# Patient Record
Sex: Female | Born: 1960 | Race: White | Hispanic: No | Marital: Married | Smoking: Never smoker
Health system: Southern US, Community
[De-identification: ages and names within clinical notes are randomized; demographics above are authoritative.]

## PROBLEM LIST (undated history)

## (undated) DIAGNOSIS — R7982 Elevated C-reactive protein (CRP): Secondary | ICD-10-CM

## (undated) DIAGNOSIS — E7800 Pure hypercholesterolemia, unspecified: Secondary | ICD-10-CM

## (undated) DIAGNOSIS — N879 Dysplasia of cervix uteri, unspecified: Secondary | ICD-10-CM

## (undated) HISTORY — PX: OOPHORECTOMY: SHX86

## (undated) HISTORY — DX: Pure hypercholesterolemia, unspecified: E78.00

## (undated) HISTORY — DX: Dysplasia of cervix uteri, unspecified: N87.9

## (undated) HISTORY — PX: JOINT REPLACEMENT: SHX530

## (undated) HISTORY — DX: Elevated C-reactive protein (CRP): R79.82

## (undated) HISTORY — PX: TOTAL HIP ARTHROPLASTY: SHX124

---

## 1999-04-19 ENCOUNTER — Encounter: Payer: Self-pay | Admitting: Emergency Medicine

## 1999-04-19 ENCOUNTER — Emergency Department (HOSPITAL_COMMUNITY): Admission: EM | Admit: 1999-04-19 | Discharge: 1999-04-19 | Payer: Self-pay | Admitting: Emergency Medicine

## 1999-12-19 ENCOUNTER — Other Ambulatory Visit: Admission: RE | Admit: 1999-12-19 | Discharge: 1999-12-19 | Payer: Self-pay | Admitting: Gynecology

## 2000-10-02 ENCOUNTER — Ambulatory Visit (HOSPITAL_COMMUNITY): Admission: RE | Admit: 2000-10-02 | Discharge: 2000-10-02 | Payer: Self-pay | Admitting: Gynecology

## 2000-10-02 ENCOUNTER — Encounter: Payer: Self-pay | Admitting: Gynecology

## 2001-03-05 ENCOUNTER — Other Ambulatory Visit: Admission: RE | Admit: 2001-03-05 | Discharge: 2001-03-05 | Payer: Self-pay | Admitting: Gynecology

## 2001-08-19 ENCOUNTER — Other Ambulatory Visit: Admission: RE | Admit: 2001-08-19 | Discharge: 2001-08-19 | Payer: Self-pay | Admitting: Gynecology

## 2001-12-21 ENCOUNTER — Emergency Department (HOSPITAL_COMMUNITY): Admission: EM | Admit: 2001-12-21 | Discharge: 2001-12-21 | Payer: Self-pay | Admitting: Emergency Medicine

## 2002-03-06 ENCOUNTER — Other Ambulatory Visit: Admission: RE | Admit: 2002-03-06 | Discharge: 2002-03-06 | Payer: Self-pay | Admitting: Gynecology

## 2003-03-18 ENCOUNTER — Other Ambulatory Visit: Admission: RE | Admit: 2003-03-18 | Discharge: 2003-03-18 | Payer: Self-pay | Admitting: Gynecology

## 2003-07-23 ENCOUNTER — Other Ambulatory Visit: Admission: RE | Admit: 2003-07-23 | Discharge: 2003-07-23 | Payer: Self-pay | Admitting: Gynecology

## 2003-08-14 HISTORY — PX: CERVICAL BIOPSY  W/ LOOP ELECTRODE EXCISION: SUR135

## 2004-03-01 ENCOUNTER — Other Ambulatory Visit: Admission: RE | Admit: 2004-03-01 | Discharge: 2004-03-01 | Payer: Self-pay | Admitting: Gynecology

## 2004-03-09 ENCOUNTER — Ambulatory Visit (HOSPITAL_COMMUNITY): Admission: RE | Admit: 2004-03-09 | Discharge: 2004-03-09 | Payer: Self-pay | Admitting: Gynecology

## 2005-03-03 ENCOUNTER — Other Ambulatory Visit: Admission: RE | Admit: 2005-03-03 | Discharge: 2005-03-03 | Payer: Self-pay | Admitting: Gynecology

## 2005-03-24 ENCOUNTER — Ambulatory Visit (HOSPITAL_COMMUNITY): Admission: RE | Admit: 2005-03-24 | Discharge: 2005-03-24 | Payer: Self-pay | Admitting: Gynecology

## 2005-10-18 ENCOUNTER — Other Ambulatory Visit: Admission: RE | Admit: 2005-10-18 | Discharge: 2005-10-18 | Payer: Self-pay | Admitting: Gynecology

## 2006-03-13 ENCOUNTER — Other Ambulatory Visit: Admission: RE | Admit: 2006-03-13 | Discharge: 2006-03-13 | Payer: Self-pay | Admitting: Gynecology

## 2006-05-07 ENCOUNTER — Ambulatory Visit (HOSPITAL_COMMUNITY): Admission: RE | Admit: 2006-05-07 | Discharge: 2006-05-07 | Payer: Self-pay | Admitting: Gynecology

## 2007-03-15 ENCOUNTER — Other Ambulatory Visit: Admission: RE | Admit: 2007-03-15 | Discharge: 2007-03-15 | Payer: Self-pay | Admitting: Gynecology

## 2007-05-10 ENCOUNTER — Ambulatory Visit (HOSPITAL_COMMUNITY): Admission: RE | Admit: 2007-05-10 | Discharge: 2007-05-10 | Payer: Self-pay | Admitting: Gynecology

## 2008-01-20 ENCOUNTER — Observation Stay (HOSPITAL_COMMUNITY): Admission: AD | Admit: 2008-01-20 | Discharge: 2008-01-21 | Payer: Self-pay | Admitting: Internal Medicine

## 2008-04-23 ENCOUNTER — Other Ambulatory Visit: Admission: RE | Admit: 2008-04-23 | Discharge: 2008-04-23 | Payer: Self-pay | Admitting: Gynecology

## 2008-07-08 ENCOUNTER — Ambulatory Visit (HOSPITAL_COMMUNITY): Admission: RE | Admit: 2008-07-08 | Discharge: 2008-07-08 | Payer: Self-pay | Admitting: Gynecology

## 2009-03-08 ENCOUNTER — Ambulatory Visit: Payer: Self-pay | Admitting: Gynecology

## 2009-04-26 ENCOUNTER — Encounter: Payer: Self-pay | Admitting: Gynecology

## 2009-04-26 ENCOUNTER — Other Ambulatory Visit: Admission: RE | Admit: 2009-04-26 | Discharge: 2009-04-26 | Payer: Self-pay | Admitting: Gynecology

## 2009-04-26 ENCOUNTER — Ambulatory Visit: Payer: Self-pay | Admitting: Gynecology

## 2010-01-14 ENCOUNTER — Ambulatory Visit (HOSPITAL_COMMUNITY): Admission: RE | Admit: 2010-01-14 | Discharge: 2010-01-14 | Payer: Self-pay | Admitting: Gynecology

## 2010-05-17 ENCOUNTER — Ambulatory Visit: Payer: Self-pay | Admitting: Gynecology

## 2010-05-17 ENCOUNTER — Other Ambulatory Visit: Admission: RE | Admit: 2010-05-17 | Discharge: 2010-05-17 | Payer: Self-pay | Admitting: Gynecology

## 2010-12-22 ENCOUNTER — Other Ambulatory Visit: Payer: Self-pay | Admitting: Gynecology

## 2010-12-22 DIAGNOSIS — Z1231 Encounter for screening mammogram for malignant neoplasm of breast: Secondary | ICD-10-CM

## 2011-01-18 ENCOUNTER — Ambulatory Visit (HOSPITAL_COMMUNITY): Payer: BC Managed Care – PPO

## 2011-01-26 ENCOUNTER — Ambulatory Visit (HOSPITAL_COMMUNITY): Payer: BC Managed Care – PPO

## 2011-01-31 ENCOUNTER — Ambulatory Visit (HOSPITAL_COMMUNITY)
Admission: RE | Admit: 2011-01-31 | Discharge: 2011-01-31 | Disposition: A | Discharge status: Home / Self Care | Payer: BC Managed Care – PPO | Source: Ambulatory Visit | Attending: Gynecology | Admitting: Gynecology

## 2011-01-31 DIAGNOSIS — Z1231 Encounter for screening mammogram for malignant neoplasm of breast: Secondary | ICD-10-CM | POA: Insufficient documentation

## 2011-03-28 NOTE — Consult Note (Signed)
NAMESHERROL, VICARS                ACCOUNT NO.:  [id number]   MEDICAL RECORD NO.:  [id number]          PATIENT TYPE:  OBV   LOCATION:  6527                         FACILITY:  [facility]   PHYSICIAN:  Colleen Can. Deborah Chalk, M.D.DATE OF BIRTH:  Jun 07, 1961   DATE OF CONSULTATION:  01/20/2008  DATE OF DISCHARGE:                                 CONSULTATION   REASON FOR CONSULTATION:  Thank you very much for asking me to see [name].  She is 50 year old female who had onset of substernal chest pain  with a little bit of tightness on the way to work.  There was a pressure-  like sensation associated with a clamminess.  She was seen in Dr.  Newell Coral office and admitted to rule out myocardial infarction.  EKG  was negative.  Cardiovascular risk factors include a positive family  history of heart disease with father dying in his 28s.  He was a smoker.  Andrey Campanile has had hypercholesterolemia with cholesterol levels of 299, all  this in 2008, and she is on simvastatin with the last lipids checked  last week and total cholesterol being 199.   She had no real dietary or other physical indiscretions that could  precipitate this type of discomfort.  She has had no gallbladder disease  that she knows of.  She is physically active and works out regularly.   PAST MEDICAL HISTORY:  1. She has had an ovarian fallopian tube removed.  2. Fractured wrist.   ALLERGIES:  No known drug allergies.   SOCIAL HISTORY:  She is a nonsmoker.  She drinks alcohol occasionally.  Her husband is a Pharmacist, hospital.  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   FAMILY HISTORY:  Her father died at age 80.  He was a smoker.  He died  of myocardial infarction.  Mother died at age 29 of metastatic cancer of  unknown origin.  One brother died of alcoholic liver disease and renal  disease.  One sister, age 50 had ovarian cancer.  One brother, age 23,  is alive and well.   REVIEW OF SYSTEMS:  Otherwise, review of systems is  negative.  GU is  negative.  She has had recent constipation treated with MiraLax.  Her  diet is moderate in nature.   PHYSICAL EXAMINATION:  GENERAL:  She is a pleasant, 50 year old female.  HEENT:  Negative.  NECK:  Supple without bruits.  LUNGS:  Clear.  HEART:  Regular rate and rhythm.  ABDOMEN:  Soft, nontender.  EXTREMITIES:  Without edema.  Pedal pulses are intact.   LABORATORY DATA AND X-RAY FINDINGS:  EKG is normal.   IMPRESSION:  1. Single episode of deep visceral chest pain, rule out myocardial      infarction.  2. Hypercholesterolemia.  3. Positive family history of heart disease.   RECOMMENDATIONS:  Will rule out myocardial infarction.  Will have low  threshold for catheterization, but if all is negative, will consider  outpatient stress Cardiolite study.  Stress Cardiolite study can be  arranged tomorrow afternoon.  I would consider discharge on aspirin and  Plavix x1 month with consideration of Pepcid as well.      Colleen Can. Deborah Chalk, M.D.  Electronically Signed     SNT/MEDQ  D:  01/20/2008  T:  01/21/2008  Job:  [id number]

## 2011-03-28 NOTE — Discharge Summary (Signed)
[name], [name]                ACCOUNT NO.:  [id number]   MEDICAL RECORD NO.:  [id number]          PATIENT TYPE:  INP   LOCATION:  6527                         FACILITY:  [facility]   PHYSICIAN:  Theressa Millard, M.D.    DATE OF BIRTH:  Mar 25, 1961   DATE OF ADMISSION:  01/20/2008  DATE OF DISCHARGE:  01/21/2008                               DISCHARGE SUMMARY   OBSERVATION DIAGNOSIS:  Chest discomfort.   DISCHARGE DIAGNOSES:  1. Chest discomfort, no evidence of coronary disease so far.  2. Hypercholesterolemia.  3. Family history of heart disease.   The patient is a 50 year old white female who was brought into the  hospital for observation because of chest pressure.  Initial EKG was  negative.   HOSPITAL COURSE:  The patient was admitted and was placed on  nitroglycerin paste.  This resulted a severe headache and some nausea  and vomiting.  This resolved with appropriate medications.  Myocardial  infarction was ruled out on the basis of serial enzymes.  She was seen  in consultation by Dr. Delfin Edis and she is to undergo an outpatient  stress Cardiolite later today in his office.   DISCHARGE MEDICATIONS:  Simvastatin 20 mg daily, aspirin 325 mg daily.   FOLLOW UP:  She will see me as scheduled.  She will see Dr. Deborah Chalk this  afternoon for a stress test.   Return to work tomorrow half time and then the next day full time.  Activities as tolerated.      Theressa Millard, M.D.  Electronically Signed     JO/MEDQ  D:  01/21/2008  T:  01/21/2008  Job:  [id number]

## 2011-05-29 ENCOUNTER — Other Ambulatory Visit: Payer: Self-pay | Admitting: Gynecology

## 2011-05-29 ENCOUNTER — Other Ambulatory Visit (HOSPITAL_COMMUNITY)
Admission: RE | Admit: 2011-05-29 | Discharge: 2011-05-29 | Disposition: A | Discharge status: Home / Self Care | Payer: BC Managed Care – PPO | Source: Ambulatory Visit | Attending: Gynecology | Admitting: Gynecology

## 2011-05-29 ENCOUNTER — Encounter (INDEPENDENT_AMBULATORY_CARE_PROVIDER_SITE_OTHER): Payer: BC Managed Care – PPO | Admitting: Gynecology

## 2011-05-29 DIAGNOSIS — Z124 Encounter for screening for malignant neoplasm of cervix: Secondary | ICD-10-CM | POA: Insufficient documentation

## 2011-05-29 DIAGNOSIS — Z833 Family history of diabetes mellitus: Secondary | ICD-10-CM

## 2011-05-29 DIAGNOSIS — Z13220 Encounter for screening for lipoid disorders: Secondary | ICD-10-CM

## 2011-05-29 DIAGNOSIS — Z01419 Encounter for gynecological examination (general) (routine) without abnormal findings: Secondary | ICD-10-CM

## 2011-05-29 DIAGNOSIS — Z3041 Encounter for surveillance of contraceptive pills: Secondary | ICD-10-CM

## 2011-07-03 ENCOUNTER — Other Ambulatory Visit: Payer: BC Managed Care – PPO

## 2011-08-07 LAB — BASIC METABOLIC PANEL
BUN: 12
CO2: 26
Calcium: 9.1
Chloride: 107
Creatinine, Ser: 0.97
GFR calc Af Amer: >60
GFR calc non Af Amer: >60
Glucose, Bld: 112 — ABNORMAL HIGH
Potassium: 4.4
Sodium: 137

## 2011-08-07 LAB — COMPREHENSIVE METABOLIC PANEL
ALT: 22
AST: 24
Albumin: 3.7
Alkaline Phosphatase: 53
BUN: 12
CO2: 24
Calcium: 9.2
Chloride: 106
Creatinine, Ser: 0.95
GFR calc Af Amer: >60
GFR calc non Af Amer: >60
Glucose, Bld: 131 — ABNORMAL HIGH
Potassium: 3.9
Sodium: 138
Total Bilirubin: 0.8
Total Protein: 7.1

## 2011-08-07 LAB — PROTIME-INR
INR: 0.9
Prothrombin Time: 12.3

## 2011-08-07 LAB — CARDIAC PANEL(CRET KIN+CKTOT+MB+TROPI)
CK, MB: 0.7
CK, MB: 0.8
Relative Index: INVALID
Total CK: 92
Troponin I: <0.01
Troponin I: <0.01

## 2011-08-07 LAB — TROPONIN I: Troponin I: <0.01

## 2011-08-07 LAB — CBC
HCT: 43.3
Hemoglobin: 14.9
MCHC: 34.5
MCV: 90.2
Platelets: 232
RBC: 4.8
RDW: 12.4
WBC: 9

## 2012-02-20 ENCOUNTER — Other Ambulatory Visit: Payer: Self-pay | Admitting: Gynecology

## 2012-02-20 DIAGNOSIS — Z1231 Encounter for screening mammogram for malignant neoplasm of breast: Secondary | ICD-10-CM

## 2012-02-21 ENCOUNTER — Ambulatory Visit (HOSPITAL_COMMUNITY)
Admission: RE | Admit: 2012-02-21 | Discharge: 2012-02-21 | Disposition: A | Discharge status: Home / Self Care | Payer: BC Managed Care – PPO | Source: Ambulatory Visit | Attending: Gynecology | Admitting: Gynecology

## 2012-02-21 DIAGNOSIS — Z1231 Encounter for screening mammogram for malignant neoplasm of breast: Secondary | ICD-10-CM | POA: Insufficient documentation

## 2012-04-11 ENCOUNTER — Encounter: Payer: Self-pay | Admitting: Gynecology

## 2012-04-11 ENCOUNTER — Ambulatory Visit (INDEPENDENT_AMBULATORY_CARE_PROVIDER_SITE_OTHER): Payer: BC Managed Care – PPO | Admitting: Gynecology

## 2012-04-11 DIAGNOSIS — R350 Frequency of micturition: Secondary | ICD-10-CM

## 2012-04-11 DIAGNOSIS — N390 Urinary tract infection, site not specified: Secondary | ICD-10-CM

## 2012-04-11 DIAGNOSIS — IMO0001 Reserved for inherently not codable concepts without codable children: Secondary | ICD-10-CM

## 2012-04-11 LAB — URINALYSIS W MICROSCOPIC + REFLEX CULTURE
Bilirubin Urine: NEGATIVE
Crystals: NONE SEEN
Glucose, UA: NEGATIVE mg/dL
Nitrite: NEGATIVE
Protein, ur: NEGATIVE mg/dL
Specific Gravity, Urine: <1.005 — ABNORMAL LOW (ref 1.005–1.030)
Urobilinogen, UA: 0.2 mg/dL (ref 0.0–1.0)

## 2012-04-11 MED ORDER — SULFAMETHOXAZOLE-TRIMETHOPRIM 800-160 MG PO TABS: 1.0000 | ORAL_TABLET | Freq: Two times a day (BID) | ORAL | Status: AC

## 2012-04-11 NOTE — Progress Notes (Signed)
Patient presents with several day history of frequency and mild end stream dysuria. No fever chills low back pain or other constitutional symptoms.  Exam Spine straight without CVA tenderness Abdomen soft with mild suprapubic tenderness. No rebound guarding masses or organomegaly.  Assessment and plan: UA and symptoms consistent with UTI. We'll treat with Septra DS 1 by mouth twice a day x3 days, follow up if symptoms persist or recur. She is due for her annual exam in July and has this scheduled.

## 2012-04-11 NOTE — Patient Instructions (Addendum)
Take Septra twice daily for 3 days. Call me if your symptoms persist or recur. Follow up for your annual exam as scheduled this summer.

## 2012-05-29 ENCOUNTER — Ambulatory Visit (INDEPENDENT_AMBULATORY_CARE_PROVIDER_SITE_OTHER): Payer: BC Managed Care – PPO | Admitting: Gynecology

## 2012-05-29 ENCOUNTER — Encounter: Payer: BC Managed Care – PPO | Admitting: Gynecology

## 2012-05-29 ENCOUNTER — Encounter: Payer: Self-pay | Admitting: Gynecology

## 2012-05-29 VITALS — BP 110/70 | Ht 69.0 in | Wt 175.0 lb

## 2012-05-29 DIAGNOSIS — E7800 Pure hypercholesterolemia, unspecified: Secondary | ICD-10-CM | POA: Insufficient documentation

## 2012-05-29 DIAGNOSIS — Z131 Encounter for screening for diabetes mellitus: Secondary | ICD-10-CM

## 2012-05-29 DIAGNOSIS — Z13220 Encounter for screening for lipoid disorders: Secondary | ICD-10-CM

## 2012-05-29 DIAGNOSIS — Z309 Encounter for contraceptive management, unspecified: Secondary | ICD-10-CM

## 2012-05-29 DIAGNOSIS — N951 Menopausal and female climacteric states: Secondary | ICD-10-CM

## 2012-05-29 DIAGNOSIS — Z01419 Encounter for gynecological examination (general) (routine) without abnormal findings: Secondary | ICD-10-CM

## 2012-05-29 LAB — CBC WITH DIFFERENTIAL/PLATELET
Basophils Absolute: 0 10*3/uL (ref 0.0–0.1)
Basophils Relative: 1 % (ref 0–1)
Eosinophils Absolute: 0.2 10*3/uL (ref 0.0–0.7)
Eosinophils Relative: 3 % (ref 0–5)
Hemoglobin: 14.4 g/dL (ref 12.0–15.0)
Lymphocytes Relative: 35 % (ref 12–46)
Lymphs Abs: 2.2 10*3/uL (ref 0.7–4.0)
Neutro Abs: 3.5 10*3/uL (ref 1.7–7.7)

## 2012-05-29 LAB — LIPID PANEL: Triglycerides: 157 mg/dL — ABNORMAL HIGH (ref <150)

## 2012-05-29 MED ORDER — NORETHINDRONE ACET-ETHINYL EST 1-20 MG-MCG PO TABS: 1.0000 | ORAL_TABLET | Freq: Every day | ORAL | Status: DC

## 2012-05-29 NOTE — Patient Instructions (Signed)
Office will contact you with lab results. If cholesterol elevated you need to see Dr. Earl Gala in follow up. You need to schedule colonoscopy. If FSH menopausal hormone elevated then we'll stop birth control pills, keep menstrual calendar and use backup contraception. If normal then we need you to repeat the level in 6 months during the pill free week.

## 2012-05-29 NOTE — Progress Notes (Signed)
[name] Dec 24, 1960 [id number]        51 y.o.  [id number] for annual exam.  Doing well.  Past medical history,surgical history, medications, allergies, family history and social history were all reviewed and documented in the EPIC chart. ROS:  Was performed and pertinent positives and negatives are included in the history.  Exam: [name] assistant Filed Vitals:   05/29/12 0913  BP: 110/70  Height: 5\' 9"  (1.753 m)  Weight: 175 lb (79.379 kg)   General appearance  Normal Skin grossly normal Head/Neck normal with no cervical or supraclavicular adenopathy thyroid normal Lungs  clear Cardiac RR, without RMG Abdominal  soft, nontender, without masses, organomegaly or hernia Breasts  examined lying and sitting without masses, retractions, discharge or axillary adenopathy. Pelvic  Ext/BUS/vagina  normal   Cervix  normal   Uterus  anteverted, normal size, shape and contour, midline and mobile nontender   Adnexa  Without masses or tenderness    Anus and perineum  normal   Rectovaginal  normal sphincter tone without palpated masses or tenderness.    Assessment/Plan:  51 y.o. [id number] female for annual exam.   1. Contraception. Patients on low dose oral contraception. She does do some hot flushes throughout the month does not seem worse during her pill free week.  Check TSH FSH. If FSH is normal she'll continue on low dose oral contraception for now and repeat an FSH during a pill free week several months from now.  If FSH is elevated will plan on stopping her birth control pills, keep a menstrual calendar and use backup contraception for now to see what she is doing from a menstrual function standpoint.  If menopausal symptoms persist/worsen then we'll discuss possibilities for HRT.  I again reviewed the risks of oral contraceptives particularly with advancing age to include stroke heart attack DVT. She understands and accepts these risks. 2. History hypercholesterolemia. She saw Dr. Earl Gala but  never followed up with him. She asked if I would recheck a lipid profile today. She understands if it is elevated she needs to see Dr. Earl Gala for management. 3. Mammography. Patient had her mammogram in April which was normal. Continue with annual mammography. SBE monthly reviewed. 4. Pap smear. Last Pap smear 2012. No Pap smear done today. Patient has history of low-grade dysplasia margins free with LEEP 2004 with negative Pap smears since then. We'll plan every 3-5 your screening per current screening guidelines. 5. Colonoscopy. Patient's colonoscopy I again recommended she arrange this and she acknowledges my recommendations. 6. Health maintenance. Baseline CBC lipid profile glucose urinalysis TSH and FSH ordered.    Dara Lords MD, 9:48 AM 05/29/2012

## 2012-05-30 LAB — URINALYSIS W MICROSCOPIC + REFLEX CULTURE
Bacteria, UA: NONE SEEN
Bilirubin Urine: NEGATIVE
Crystals: NONE SEEN
Hgb urine dipstick: NEGATIVE
Ketones, ur: NEGATIVE mg/dL
Nitrite: NEGATIVE
Specific Gravity, Urine: 1.013 (ref 1.005–1.030)
Urobilinogen, UA: 0.2 mg/dL (ref 0.0–1.0)

## 2013-06-11 ENCOUNTER — Telehealth: Payer: Self-pay | Admitting: *Deleted

## 2013-06-11 MED ORDER — NORETHINDRONE ACET-ETHINYL EST 1-20 MG-MCG PO TABS: 1.0000 | ORAL_TABLET | Freq: Every day | ORAL | Status: DC

## 2013-06-11 NOTE — Telephone Encounter (Signed)
Pt has annual scheduled on 07/17/13 will need refill on birth control pills until seen in office. Rx sent with 1 refill. Pt informed

## 2013-07-17 ENCOUNTER — Encounter: Payer: Self-pay | Admitting: Gynecology

## 2013-08-20 ENCOUNTER — Ambulatory Visit (INDEPENDENT_AMBULATORY_CARE_PROVIDER_SITE_OTHER): Payer: BC Managed Care – PPO | Admitting: Gynecology

## 2013-08-20 ENCOUNTER — Encounter: Payer: Self-pay | Admitting: Gynecology

## 2013-08-20 VITALS — BP 120/74 | Ht 69.0 in | Wt 171.0 lb

## 2013-08-20 DIAGNOSIS — Z01419 Encounter for gynecological examination (general) (routine) without abnormal findings: Secondary | ICD-10-CM

## 2013-08-20 DIAGNOSIS — Z13220 Encounter for screening for lipoid disorders: Secondary | ICD-10-CM

## 2013-08-20 DIAGNOSIS — N951 Menopausal and female climacteric states: Secondary | ICD-10-CM

## 2013-08-20 NOTE — Patient Instructions (Addendum)
Return for fasting blood work.  Otherwise follow up in one year for annual exam. Followup sooner if menopausal symptoms worsened and you want to rediscuss hormone replacement therapy.  Schedule colonoscopy with [facility] gastroenterology at [phone] or [facility] gastroenterology at [phone]  Call to Schedule your mammogram  Facilities in [place]: 1)  [facility] [place], [place] Phone: [phone] 2)  [facility], [phone] Phone: [phone] 3)  Dr. Yolanda Bonine at [facility] [address] Phone: [phone]     Mammogram A mammogram is an X-ray test to find changes in a woman's breast. You should get a mammogram if:  You are 50 years of age or older  You have risk factors.   Your doctor recommends that you have one.  BEFORE THE TEST  Do not schedule the test the week before your period, especially if your breasts are sore during this time.  On the day of your mammogram:  Wash your breasts and armpits well. After washing, do not put on any deodorant or talcum powder on until after your test.   Eat and drink as you usually do.   Take your medicines as usual.   If you are diabetic and take insulin, make sure you:   Eat before coming for your test.   Take your insulin as usual.   If you cannot keep your appointment, call before the appointment to cancel. Schedule another appointment.  TEST  You will need to undress from the waist up. You will put on a hospital gown.   Your breast will be put on the mammogram machine, and it will press firmly on your breast with a piece of plastic called a compression paddle. This will make your breast flatter so that the machine can X-ray all parts of your breast.   Both breasts will be X-rayed. Each breast will be X-rayed from above and from the side. An X-ray might need to be taken again if the picture is not good enough.    The mammogram will last about 15 to 30 minutes.  AFTER THE TEST Finding out the results of your test Ask when your test results will be ready. Make sure you get your test results.  Document Released: 01/26/2009 Document Revised: 10/19/2011 Document Reviewed: 01/26/2009 [facility] Patient Information 2012 [place], [place].

## 2013-08-20 NOTE — Progress Notes (Signed)
[name] 04-04-1961 [id number]        52 y.o.  [id number] for annual exam.  Several issues that are below.  Past medical history,surgical history, medications, allergies, family history and social history were all reviewed and documented in the EPIC chart.  ROS:  Performed and pertinent positives and negatives are included in the history, assessment and plan .  Exam: [name] assistant Filed Vitals:   08/20/13 1421  BP: 120/74  Height: 5\' 9"  (1.753 m)  Weight: 171 lb (77.565 kg)   General appearance  Normal Skin grossly normal Head/Neck normal with no cervical or supraclavicular adenopathy thyroid normal Lungs  clear Cardiac RR, without RMG Abdominal  soft, nontender, without masses, organomegaly or hernia Breasts  examined lying and sitting without masses, retractions, discharge or axillary adenopathy. Pelvic  Ext/BUS/vagina  normal  Cervix  normal  Uterus  anteverted, normal size, shape and contour, midline and mobile nontender   Adnexa  Without masses or tenderness    Anus and perineum  normal   Rectovaginal  normal sphincter tone without palpated masses or tenderness.    Assessment/Plan:  52 y.o. [id number] female for annual exam.   1. Amenorrhea/menopausal symptoms. Patient had been on Loestrin 120 equivalents but has not had a withdrawal bleed for a year.  Had stopped them several weeks ago and has not had any bleeding but worsening hot flushes and night sweats. We'll check FSH when she does her fasting lab work. Will stay off of BCPs at this point and use backup contraception. I reviewed the issue of HRT and risks to include stroke heart attack DVT and breast cancer. ACOG and NAMS statements for lowest dose for shortest period of time discussed. Patient is not interested in trying at this point but wants to monitor her symptoms.  2. Mammography 02/2012. Patient knows she's overdue and agrees to schedule. SBE monthly reviewed. 3. Pap smear 2012. No Pap smear done today. 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 Plan repeat Pap smear next year a 3 year interval. 4. Colonoscopy never. Strongly recommended that she schedule baseline. Discussed the benefits of early detection and she agrees to arrange. 5. Health maintenance. Baseline CBC comprehensive metabolic panel lipid profile vitamin D urinalysis TSH ordered as a future order and she's going to return fasting. She never followed up with Dr. Earl Gala as discussed last year in reference to her elevated cholesterol.  Note: This document was prepared with digital dictation and possible smart phrase technology. Any transcriptional errors that result from this process are unintentional.   Dara Lords MD, 2:48 PM 08/20/2013

## 2013-08-21 ENCOUNTER — Encounter: Payer: Self-pay | Admitting: Gynecology

## 2013-08-25 ENCOUNTER — Other Ambulatory Visit: Payer: BC Managed Care – PPO

## 2013-08-25 DIAGNOSIS — N951 Menopausal and female climacteric states: Secondary | ICD-10-CM

## 2013-08-25 DIAGNOSIS — Z13220 Encounter for screening for lipoid disorders: Secondary | ICD-10-CM

## 2013-08-25 DIAGNOSIS — Z01419 Encounter for gynecological examination (general) (routine) without abnormal findings: Secondary | ICD-10-CM

## 2013-08-25 LAB — CBC WITH DIFFERENTIAL/PLATELET
Eosinophils Relative: 8 % — ABNORMAL HIGH (ref 0–5)
HCT: 42 % (ref 36.0–46.0)
Lymphocytes Relative: 37 % (ref 12–46)
Lymphs Abs: 2.2 10*3/uL (ref 0.7–4.0)
MCH: 31.5 pg (ref 26.0–34.0)
MCV: 90.5 fL (ref 78.0–100.0)
Monocytes Absolute: 0.5 10*3/uL (ref 0.1–1.0)
Neutro Abs: 2.8 10*3/uL (ref 1.7–7.7)
Platelets: 271 10*3/uL (ref 150–400)
RBC: 4.64 MIL/uL (ref 3.87–5.11)
RDW: 13 % (ref 11.5–15.5)
WBC: 6 10*3/uL (ref 4.0–10.5)

## 2013-08-25 LAB — COMPREHENSIVE METABOLIC PANEL
ALT: 20 U/L (ref 0–35)
BUN: 13 mg/dL (ref 6–23)
CO2: 28 mEq/L (ref 19–32)
Calcium: 9.5 mg/dL (ref 8.4–10.5)
Chloride: 104 mEq/L (ref 96–112)
Creat: 0.8 mg/dL (ref 0.50–1.10)
Glucose, Bld: 97 mg/dL (ref 70–99)
Total Bilirubin: 0.9 mg/dL (ref 0.3–1.2)

## 2013-08-25 LAB — LIPID PANEL
HDL: 61 mg/dL (ref >39)
LDL Cholesterol: 176 mg/dL — ABNORMAL HIGH (ref 0–99)
Triglycerides: 83 mg/dL (ref <150)

## 2013-08-26 LAB — FOLLICLE STIMULATING HORMONE: FSH: 8 m[IU]/mL

## 2013-08-26 LAB — VITAMIN D 25 HYDROXY (VIT D DEFICIENCY, FRACTURES): Vit D, 25-Hydroxy: 55 ng/mL (ref 30–89)

## 2013-09-17 ENCOUNTER — Ambulatory Visit (INDEPENDENT_AMBULATORY_CARE_PROVIDER_SITE_OTHER): Payer: BC Managed Care – PPO | Admitting: Women's Health

## 2013-09-17 ENCOUNTER — Encounter: Payer: Self-pay | Admitting: Women's Health

## 2013-09-17 DIAGNOSIS — Z309 Encounter for contraceptive management, unspecified: Secondary | ICD-10-CM

## 2013-09-17 DIAGNOSIS — R3915 Urgency of urination: Secondary | ICD-10-CM

## 2013-09-17 DIAGNOSIS — IMO0001 Reserved for inherently not codable concepts without codable children: Secondary | ICD-10-CM

## 2013-09-17 DIAGNOSIS — N390 Urinary tract infection, site not specified: Secondary | ICD-10-CM

## 2013-09-17 LAB — URINALYSIS W MICROSCOPIC + REFLEX CULTURE
Casts: NONE SEEN
Crystals: NONE SEEN
Ketones, ur: NEGATIVE mg/dL
Nitrite: NEGATIVE
Protein, ur: NEGATIVE mg/dL
Specific Gravity, Urine: 1.01 (ref 1.005–1.030)
Urobilinogen, UA: 0.2 mg/dL (ref 0.0–1.0)
pH: 7.5 (ref 5.0–8.0)

## 2013-09-17 MED ORDER — SULFAMETHOXAZOLE-TRIMETHOPRIM 800-160 MG PO TABS: 1.0000 | ORAL_TABLET | Freq: Two times a day (BID) | ORAL | Status: DC

## 2013-09-17 MED ORDER — NORETHINDRONE ACET-ETHINYL EST 1-20 MG-MCG PO TABS
1.0000 | ORAL_TABLET | Freq: Every day | ORAL | Status: DC
Start: 2013-09-17 — End: 2014-09-02

## 2013-09-17 NOTE — Patient Instructions (Signed)
Urinary Tract Infection  Urinary tract infections (UTIs) can develop anywhere along your urinary tract. Your urinary tract is your body's drainage system for removing wastes and extra water. Your urinary tract includes two kidneys, two ureters, a bladder, and a urethra. Your kidneys are a pair of bean-shaped organs. Each kidney is about the size of your fist. They are located below your ribs, one on each side of your spine.  CAUSES  Infections are caused by microbes, which are microscopic organisms, including fungi, viruses, and bacteria. These organisms are so small that they can only be seen through a microscope. Bacteria are the microbes that most commonly cause UTIs.  SYMPTOMS   Symptoms of UTIs may vary by age and gender of the patient and by the location of the infection. Symptoms in Verniece Encarnacion women typically include a frequent and intense urge to urinate and a painful, burning feeling in the bladder or urethra during urination. Older women and men are more likely to be tired, shaky, and weak and have muscle aches and abdominal pain. A fever may mean the infection is in your kidneys. Other symptoms of a kidney infection include pain in your back or sides below the ribs, nausea, and vomiting.  DIAGNOSIS  To diagnose a UTI, your caregiver will ask you about your symptoms. Your caregiver also will ask to provide a urine sample. The urine sample will be tested for bacteria and white blood cells. White blood cells are made by your body to help fight infection.  TREATMENT   Typically, UTIs can be treated with medication. Because most UTIs are caused by a bacterial infection, they usually can be treated with the use of antibiotics. The choice of antibiotic and length of treatment depend on your symptoms and the type of bacteria causing your infection.  HOME CARE INSTRUCTIONS   If you were prescribed antibiotics, take them exactly as your caregiver instructs you. Finish the medication even if you feel better after you  have only taken some of the medication.   Drink enough water and fluids to keep your urine clear or pale yellow.   Avoid caffeine, tea, and carbonated beverages. They tend to irritate your bladder.   Empty your bladder often. Avoid holding urine for long periods of time.   Empty your bladder before and after sexual intercourse.   After a bowel movement, women should cleanse from front to back. Use each tissue only once.  SEEK MEDICAL CARE IF:    You have back pain.   You develop a fever.   Your symptoms do not begin to resolve within 3 days.  SEEK IMMEDIATE MEDICAL CARE IF:    You have severe back pain or lower abdominal pain.   You develop chills.   You have nausea or vomiting.   You have continued burning or discomfort with urination.  MAKE SURE YOU:    Understand these instructions.   Will watch your condition.   Will get help right away if you are not doing well or get worse.  Document Released: 08/09/2005 Document Revised: 04/30/2012 Document Reviewed: 12/08/2011  [employer] Patient Information 2014 [employer].

## 2013-09-17 NOTE — Progress Notes (Signed)
Patient ID: [name], female   DOB: 1961-10-06, 52 y.o.   MRN: [id number] Presents with urinary urgency, frequency and discomfort with no relief with cranberry juice for 4 days. Denies vaginal discharge, abdominal pain, fever. Rare  UTIs in the past. Annual exam last month. Had been amenorrheic on Loestrin 1/20,. Stopped Loestrin, FSH 8, had a heavy cycle 2 weeks after stopping pills. Would like to start back on Loestrin.  Exam: Appears well. UA: Large leukocytes, TNTC WBCs, many bacteria.  UTI Contraception management  Plan: Septra DS twice daily for 3 days, prescription, proper use given and reviewed. Urine culture pending. Instructed to call if no relief of symptoms. Loestrin 1/20 prescription, proper use, risk for blood clots and strokes reviewed. Nonsmoker with no known health problems. Start up instructions reviewed. Condoms until next cycle and first month back on.

## 2013-09-23 ENCOUNTER — Telehealth: Payer: Self-pay | Admitting: *Deleted

## 2013-09-23 MED ORDER — CIPROFLOXACIN HCL 250 MG PO TABS: 250.0000 mg | ORAL_TABLET | Freq: Two times a day (BID) | ORAL | Status: DC

## 2013-09-23 NOTE — Telephone Encounter (Signed)
Pt informed with the below note, rx sent. 

## 2013-09-23 NOTE — Telephone Encounter (Signed)
Pt was treated for Septra DS twice daily for 3 days on 09/17/13, on Sunday noticed urgency and pressure back again now with some burning with urination. Please advise

## 2013-09-23 NOTE — Telephone Encounter (Signed)
Please call and call in Cipro 250 twice daily for 3 days #6. Septra not be best antibiotic for infection she had. New antibiotic should completely rectify problem. Have her call if no relief.

## 2014-03-31 ENCOUNTER — Other Ambulatory Visit: Payer: Self-pay | Admitting: Gynecology

## 2014-03-31 DIAGNOSIS — Z1231 Encounter for screening mammogram for malignant neoplasm of breast: Secondary | ICD-10-CM

## 2014-04-08 ENCOUNTER — Ambulatory Visit (HOSPITAL_COMMUNITY)
Admission: RE | Admit: 2014-04-08 | Discharge: 2014-04-08 | Disposition: A | Discharge status: Home / Self Care | Payer: BC Managed Care – PPO | Source: Ambulatory Visit | Attending: Gynecology | Admitting: Gynecology

## 2014-04-08 DIAGNOSIS — Z1231 Encounter for screening mammogram for malignant neoplasm of breast: Secondary | ICD-10-CM | POA: Insufficient documentation

## 2014-04-09 ENCOUNTER — Other Ambulatory Visit: Payer: Self-pay | Admitting: Gynecology

## 2014-04-09 DIAGNOSIS — R928 Other abnormal and inconclusive findings on diagnostic imaging of breast: Secondary | ICD-10-CM

## 2014-04-20 ENCOUNTER — Ambulatory Visit
Admission: RE | Admit: 2014-04-20 | Discharge: 2014-04-20 | Disposition: A | Discharge status: Home / Self Care | Payer: BC Managed Care – PPO | Source: Ambulatory Visit | Attending: Gynecology | Admitting: Gynecology

## 2014-04-20 DIAGNOSIS — R928 Other abnormal and inconclusive findings on diagnostic imaging of breast: Secondary | ICD-10-CM

## 2014-08-31 ENCOUNTER — Telehealth: Payer: Self-pay | Admitting: *Deleted

## 2014-08-31 NOTE — Telephone Encounter (Signed)
Pt called c/o having a cycle after no cycle in 2 years, c/o right side abdomen discomfort as well. Pt transferred to front desk to schedule appointment.

## 2014-09-02 ENCOUNTER — Ambulatory Visit (INDEPENDENT_AMBULATORY_CARE_PROVIDER_SITE_OTHER): Payer: BC Managed Care – PPO | Admitting: Gynecology

## 2014-09-02 ENCOUNTER — Encounter: Payer: Self-pay | Admitting: Gynecology

## 2014-09-02 DIAGNOSIS — N939 Abnormal uterine and vaginal bleeding, unspecified: Secondary | ICD-10-CM

## 2014-09-02 DIAGNOSIS — R102 Pelvic and perineal pain: Secondary | ICD-10-CM

## 2014-09-02 LAB — CBC WITH DIFFERENTIAL/PLATELET
BASOS ABS: 0.1 10*3/uL (ref 0.0–0.1)
BASOS PCT: 1 % (ref 0–1)
EOS ABS: 0.2 10*3/uL (ref 0.0–0.7)
EOS PCT: 3 % (ref 0–5)
HCT: 41.7 % (ref 36.0–46.0)
Hemoglobin: 14 g/dL (ref 12.0–15.0)
LYMPHS PCT: 33 % (ref 12–46)
Lymphs Abs: 2.6 10*3/uL (ref 0.7–4.0)
MCH: 30.6 pg (ref 26.0–34.0)
MCHC: 33.6 g/dL (ref 30.0–36.0)
MCV: 91 fL (ref 78.0–100.0)
Monocytes Absolute: 0.6 10*3/uL (ref 0.1–1.0)
Monocytes Relative: 7 % (ref 3–12)
Neutro Abs: 4.5 10*3/uL (ref 1.7–7.7)
Neutrophils Relative %: 56 % (ref 43–77)
PLATELETS: 316 10*3/uL (ref 150–400)
RBC: 4.58 MIL/uL (ref 3.87–5.11)
RDW: 13.7 % (ref 11.5–15.5)
WBC: 8 10*3/uL (ref 4.0–10.5)

## 2014-09-02 MED ORDER — MEGESTROL ACETATE 20 MG PO TABS: 20.0000 mg | ORAL_TABLET | Freq: Every day | ORAL | Status: DC

## 2014-09-02 MED ORDER — IBUPROFEN 800 MG PO TABS: 800.0000 mg | ORAL_TABLET | Freq: Three times a day (TID) | ORAL | Status: DC | PRN

## 2014-09-02 NOTE — Progress Notes (Signed)
Carly FettersSandra J Harper April 30, 1961 [id number]        53 y.o.  [id number] Presents noting her last menstrual period was several years ago with the onset of vaginal bleeding 5 days ago leading to heavy passage of clots which now seems to be resolving. She's also having a lot of pelvic cramping. Does note several days before her breasts were tender and she started to feel bloated like she did premenstrually when she was having regular periods. Not using contraception. No recent change in medications or steroid use. No hair skin or weight changes.  She is having hot flushes and night sweats.  Past medical history,surgical history, problem list, medications, allergies, family history and social history were all reviewed and documented in the EPIC chart.  Directed ROS with pertinent positives and negatives documented in the history of present illness/assessment and plan.  Exam: [name] assistant General appearance:  Normal Abdomen soft nontender without masses guarding rebound. Pelvic external BUS vagina with light menses flow. Cervix normal. Uterus anteverted normal size midline mobile nontender. Adnexa without masses or tenderness.  Assessment/Plan:  53 y.o. G2X5284G3P2012 with postmenopausal bleeding. Question escape ovulation given the present menstrual feeling before hand. Reviewed other possibilities to include endometrial abnormalities. Check baseline CBC, FSH and qualitative hCG. Schedule sonohysterogram to rule out intracavitary abnormalities allow for endometrial sample various scenarios and treatment plans reviewed depending upon results.  We'll treat transiently with Megace 20 mg daily for one week to resolve her bleeding. ASAP call precautions reviewed. Follow up for ultrasound.     Dara LordsFONTAINE,Tyrhonda Georgiades P MD, 12:16 PM 09/02/2014

## 2014-09-02 NOTE — Patient Instructions (Signed)
Follow up for ultrasound as scheduled 

## 2014-09-03 LAB — FOLLICLE STIMULATING HORMONE: FSH: 24.1 m[IU]/mL

## 2014-09-03 LAB — HCG, QUANTITATIVE, PREGNANCY: hCG, Beta Chain, Quant, S: <2 m[IU]/mL

## 2014-09-07 ENCOUNTER — Other Ambulatory Visit: Payer: Self-pay | Admitting: Gynecology

## 2014-09-07 DIAGNOSIS — N939 Abnormal uterine and vaginal bleeding, unspecified: Secondary | ICD-10-CM

## 2014-09-07 DIAGNOSIS — R102 Pelvic and perineal pain: Secondary | ICD-10-CM

## 2014-09-14 ENCOUNTER — Encounter: Payer: Self-pay | Admitting: Gynecology

## 2014-09-17 ENCOUNTER — Ambulatory Visit (INDEPENDENT_AMBULATORY_CARE_PROVIDER_SITE_OTHER): Payer: BC Managed Care – PPO

## 2014-09-17 ENCOUNTER — Other Ambulatory Visit: Payer: Self-pay | Admitting: Gynecology

## 2014-09-17 ENCOUNTER — Ambulatory Visit (INDEPENDENT_AMBULATORY_CARE_PROVIDER_SITE_OTHER): Payer: BC Managed Care – PPO | Admitting: Gynecology

## 2014-09-17 ENCOUNTER — Encounter: Payer: Self-pay | Admitting: Gynecology

## 2014-09-17 DIAGNOSIS — N939 Abnormal uterine and vaginal bleeding, unspecified: Secondary | ICD-10-CM

## 2014-09-17 DIAGNOSIS — N8000 Endometriosis of the uterus, unspecified: Secondary | ICD-10-CM

## 2014-09-17 DIAGNOSIS — N950 Postmenopausal bleeding: Secondary | ICD-10-CM

## 2014-09-17 DIAGNOSIS — R102 Pelvic and perineal pain: Secondary | ICD-10-CM

## 2014-09-17 DIAGNOSIS — R140 Abdominal distension (gaseous): Secondary | ICD-10-CM

## 2014-09-17 NOTE — Progress Notes (Signed)
Carly FettersSandra J Harper [birth year] [id number]        53 y.o.  [id number] presents for sonohysterogram due to episode of postmenopausal bleeding. Has not had a menses for 2 years and then had a spontaneous heavy bleed with premenstrual type symptoms. FSH checked was 24. It was 8 last year.  Past medical history,surgical history, problem list, medications, allergies, family history and social history were all reviewed and documented in the EPIC chart.  Directed ROS with pertinent positives and negatives documented in the history of present illness/assessment and plan.  Exam: [name] assistant General appearance:  Normal External BUS vagina with mild atrophic changes. Cervix grossly normal.  Ultrasound shows uterus normal size. Endometrial echo 1.9 mm. Right left ovaries normal. Cul-de-sac negative.  Sonohysterogram performed, sterile technique, easy catheter introduction, good distention with no abnormalities. Endometrial sample taken. Patient tolerated well.  Assessment/Plan:  53 y.o. J4N8295G3P2012 with single episode of postmenopausal bleeding. I suspect given the marginal FSH and the premenstrual symptoms preceding the bleed that she had an escape ovulation. Endometrial echo is thin. Will follow up for endometrial biopsy. Would not be surprised if it was inadequate.  Patient will keep menstrual calendar as long as no further bleeding will monitor. She'll report any further bleeding. She is due for her annual exam next month and will follow up at that time. Sooner if any issues.     Dara LordsFONTAINE,[name] P MD, 5:00 PM 09/17/2014

## 2014-09-17 NOTE — Patient Instructions (Signed)
Office will call you with biopsy results 

## 2014-09-18 LAB — URINALYSIS W MICROSCOPIC + REFLEX CULTURE
BILIRUBIN URINE: NEGATIVE
Casts: NONE SEEN
Crystals: NONE SEEN
GLUCOSE, UA: NEGATIVE mg/dL
HGB URINE DIPSTICK: NEGATIVE
Ketones, ur: NEGATIVE mg/dL
Nitrite: NEGATIVE
PROTEIN: NEGATIVE mg/dL
Specific Gravity, Urine: 1.008 (ref 1.005–1.030)
Urobilinogen, UA: 0.2 mg/dL (ref 0.0–1.0)
pH: 5.5 (ref 5.0–8.0)

## 2014-09-19 LAB — URINE CULTURE
COLONY COUNT: NO GROWTH
ORGANISM ID, BACTERIA: NO GROWTH

## 2014-10-16 ENCOUNTER — Encounter: Payer: BC Managed Care – PPO | Admitting: Gynecology

## 2015-06-23 ENCOUNTER — Other Ambulatory Visit: Payer: Self-pay | Admitting: Gynecology

## 2015-06-23 DIAGNOSIS — Z1231 Encounter for screening mammogram for malignant neoplasm of breast: Secondary | ICD-10-CM

## 2015-06-30 ENCOUNTER — Ambulatory Visit (HOSPITAL_COMMUNITY)
Admission: RE | Admit: 2015-06-30 | Discharge: 2015-06-30 | Disposition: A | Discharge status: Home / Self Care | Payer: BLUE CROSS/BLUE SHIELD | Source: Ambulatory Visit | Attending: Gynecology | Admitting: Gynecology

## 2015-06-30 DIAGNOSIS — Z1231 Encounter for screening mammogram for malignant neoplasm of breast: Secondary | ICD-10-CM

## 2015-08-23 ENCOUNTER — Encounter: Payer: Self-pay | Admitting: Gynecology

## 2015-08-23 ENCOUNTER — Other Ambulatory Visit (HOSPITAL_COMMUNITY)
Admission: RE | Admit: 2015-08-23 | Discharge: 2015-08-23 | Disposition: A | Discharge status: Home / Self Care | Payer: BLUE CROSS/BLUE SHIELD | Source: Ambulatory Visit | Attending: Gynecology | Admitting: Gynecology

## 2015-08-23 ENCOUNTER — Ambulatory Visit (INDEPENDENT_AMBULATORY_CARE_PROVIDER_SITE_OTHER): Payer: BLUE CROSS/BLUE SHIELD | Admitting: Gynecology

## 2015-08-23 VITALS — BP 114/70 | Ht 70.0 in | Wt 156.0 lb

## 2015-08-23 DIAGNOSIS — N951 Menopausal and female climacteric states: Secondary | ICD-10-CM

## 2015-08-23 DIAGNOSIS — N952 Postmenopausal atrophic vaginitis: Secondary | ICD-10-CM | POA: Diagnosis not present

## 2015-08-23 DIAGNOSIS — Z01419 Encounter for gynecological examination (general) (routine) without abnormal findings: Secondary | ICD-10-CM | POA: Diagnosis not present

## 2015-08-23 LAB — CBC WITH DIFFERENTIAL/PLATELET
BASOS ABS: 0.1 10*3/uL (ref 0.0–0.1)
BASOS PCT: 1 % (ref 0–1)
EOS ABS: 0.2 10*3/uL (ref 0.0–0.7)
Eosinophils Relative: 3 % (ref 0–5)
HCT: 41.2 % (ref 36.0–46.0)
Hemoglobin: 14.1 g/dL (ref 12.0–15.0)
Lymphocytes Relative: 33 % (ref 12–46)
Lymphs Abs: 2.6 10*3/uL (ref 0.7–4.0)
MCH: 30.4 pg (ref 26.0–34.0)
MCHC: 34.2 g/dL (ref 30.0–36.0)
MCV: 88.8 fL (ref 78.0–100.0)
MPV: 9.2 fL (ref 8.6–12.4)
Monocytes Absolute: 0.5 10*3/uL (ref 0.1–1.0)
Monocytes Relative: 6 % (ref 3–12)
Neutro Abs: 4.4 10*3/uL (ref 1.7–7.7)
Neutrophils Relative %: 57 % (ref 43–77)
PLATELETS: 258 10*3/uL (ref 150–400)
RBC: 4.64 MIL/uL (ref 3.87–5.11)
RDW: 13.6 % (ref 11.5–15.5)
WBC: 7.8 10*3/uL (ref 4.0–10.5)

## 2015-08-23 NOTE — Progress Notes (Signed)
[name] 1961/05/22 [id number]        54 y.o.  [id number] for annual exam.  Several issues noted below.  Past medical history,surgical history, problem list, medications, allergies, family history and social history were all reviewed and documented as reviewed in the EPIC chart.  ROS:  Performed with pertinent positives and negatives included in the history, assessment and plan.   Additional significant findings :  none   Exam: [name] person Vitals:   08/23/15 1507  BP: 114/70  Height:  (1.778 m)  Weight: 156 lb (70.761 kg)   General appearance:  Normal affect, orientation and appearance. Skin: Grossly normal HEENT: Without gross lesions.  No cervical or supraclavicular adenopathy. Thyroid normal.  Lungs:  Clear without wheezing, rales or rhonchi Cardiac: RR, without RMG Abdominal:  Soft, nontender, without masses, guarding, rebound, organomegaly or hernia Breasts:  Examined lying and sitting without masses, retractions, discharge or axillary adenopathy. Pelvic:  Ext/BUS/vagina with atrophic changes  Cervix normal with atrophic changes. Pap smear done  Uterus anteverted, normal size, shape and contour, midline and mobile nontender   Adnexa  Without masses or tenderness    Anus and perineum  Normal   Rectovaginal  Normal sphincter tone without palpated masses or tenderness.    Assessment/Plan:  54 y.o. [id number] female for annual exam.   1. Postmenopausal/atrophic genital changes. Is having some hot flashes/night sweats. No significant vaginal dryness or dyspareunia. No vaginal bleeding. Options for management reviewed to include OTC products up to and including HRT. Patient's going to try OTC soy based products. Will follow up if she wants to rediscuss HRT. Patient knows to report any vaginal bleeding. 2. Mammography 06/2015. Continue with annual mammography. SBE monthly reviewed. 3. Pap smear 05/2011. Pap smear done today.  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  Normal Pap  smears since then. 4. DEXA never. Will plan further into the menopause. Check vitamin D level today. 5. Colonoscopy never. Stressed the need to schedule that she agrees to do so. Names and numbers provided. 6. Health maintenance. Patient in the process of finding a new primary physician. She requested baseline labs. CBC comprehensive metabolic panel lipid profile urinalysis TSH vitamin D ordered. Follow up in one year, sooner as needed.   Dara Lords MD, 3:33 PM 08/23/2015

## 2015-08-23 NOTE — Addendum Note (Signed)
Addended by: Dayna Barker on: 08/23/2015 03:40 PM   Modules accepted: Orders

## 2015-08-23 NOTE — Patient Instructions (Signed)
Schedule your colonoscopy with either:  [facility]   Address: [address], [place], [place] [address]  [phone]    or  [facility]  Address: [address], [place], [place] [address]  [phone]      You may obtain a copy of any labs that were done today by logging onto MyChart as outlined in the instructions provided with your AVS (after visit summary). The office will not call with normal lab results but certainly if there are any significant abnormalities then we will contact you.   Health Maintenance Adopting a healthy lifestyle and getting preventive care can go a long way to promote health and wellness. Talk with your health care provider about what schedule of regular examinations is right for you. This is a good chance for you to check in with your provider about disease prevention and staying healthy. In between checkups, there are plenty of things you can do on your own. Experts have done a lot of research about which lifestyle changes and preventive measures are most likely to keep you healthy. Ask your health care provider for more information. WEIGHT AND DIET  Eat a healthy diet  Be sure to include plenty of vegetables, fruits, low-fat dairy products, and lean protein.  Do not eat a lot of foods high in solid fats, added sugars, or salt.  Get regular exercise. This is one of the most important things you can do for your health.  Most adults should exercise for at least 150 minutes each week. The exercise should increase your heart rate and make you sweat (moderate-intensity exercise).  Most adults should also do strengthening exercises at least twice a week. This is in addition to the moderate-intensity exercise.  Maintain a healthy weight  Body mass index (BMI) is a measurement that can be used to identify possible weight problems. It estimates body fat based on height and weight. Your health care provider can help determine  your BMI and help you achieve or maintain a healthy weight.  For females 22 years of age and older:   A BMI below 18.5 is considered underweight.  A BMI of 18.5 to 24.9 is normal.  A BMI of 25 to 29.9 is considered overweight.  A BMI of 30 and above is considered obese.  Watch levels of cholesterol and blood lipids  You should start having your blood tested for lipids and cholesterol at 55 years of age, then have this test every 5 years.  You may need to have your cholesterol levels checked more often if:  Your lipid or cholesterol levels are high.  You are older than 55 years of age.  You are at high risk for heart disease.  CANCER SCREENING   Lung Cancer  Lung cancer screening is recommended for adults 19-51 years old who are at high risk for lung cancer because of a history of smoking.  A yearly low-dose CT scan of the lungs is recommended for people who:  Currently smoke.  Have quit within the past 15 years.  Have at least a 30-pack-year history of smoking. A pack year is smoking an average of one pack of cigarettes a day for 1 year.  Yearly screening should continue until it has been 15 years since you quit.  Yearly screening should stop if you develop a health problem that would prevent you from having lung cancer treatment.  Breast Cancer  Practice breast self-awareness. This means understanding how your breasts normally appear and  feel.  It also means doing regular breast self-exams. Let your health care provider know about any changes, no matter how small.  If you are in your 20s or 30s, you should have a clinical breast exam (CBE) by a health care provider every 1-3 years as part of a regular health exam.  If you are 35 or older, have a CBE every year. Also consider having a breast X-ray (mammogram) every year.  If you have a family history of breast cancer, talk to your health care provider about genetic screening.  If you are at high risk for breast  cancer, talk to your health care provider about having an MRI and a mammogram every year.  Breast cancer gene (BRCA) assessment is recommended for women who have family members with BRCA-related cancers. BRCA-related cancers include:  Breast.  Ovarian.  Tubal.  Peritoneal cancers.  Results of the assessment will determine the need for genetic counseling and BRCA1 and BRCA2 testing. Cervical Cancer Routine pelvic examinations to screen for cervical cancer are no longer recommended for nonpregnant women who are considered low risk for cancer of the pelvic organs (ovaries, uterus, and vagina) and who do not have symptoms. A pelvic examination may be necessary if you have symptoms including those associated with pelvic infections. Ask your health care provider if a screening pelvic exam is right for you.   The Pap test is the screening test for cervical cancer for women who are considered at risk.  If you had a hysterectomy for a problem that was not cancer or a condition that could lead to cancer, then you no longer need Pap tests.  If you are older than 65 years, and you have had normal Pap tests for the past 10 years, you no longer need to have Pap tests.  If you have had past treatment for cervical cancer or a condition that could lead to cancer, you need Pap tests and screening for cancer for at least 20 years after your treatment.  If you no longer get a Pap test, assess your risk factors if they change (such as having a new sexual partner). This can affect whether you should start being screened again.  Some women have medical problems that increase their chance of getting cervical cancer. If this is the case for you, your health care provider may recommend more frequent screening and Pap tests.  The human papillomavirus (HPV) test is another test that may be used for cervical cancer screening. The HPV test looks for the virus that can cause cell changes in the cervix. The cells  collected during the Pap test can be tested for HPV.  The HPV test can be used to screen women 72 years of age and older. Getting tested for HPV can extend the interval between normal Pap tests from three to five years.  An HPV test also should be used to screen women of any age who have unclear Pap test results.  After 55 years of age, women should have HPV testing as often as Pap tests.  Colorectal Cancer  This type of cancer can be detected and often prevented.  Routine colorectal cancer screening usually begins at 55 years of age and continues through 55 years of age.  Your health care provider may recommend screening at an earlier age if you have risk factors for colon cancer.  Your health care provider may also recommend using home test kits to check for hidden blood in the stool.  A small camera  at the end of a tube can be used to examine your colon directly (sigmoidoscopy or colonoscopy). This is done to check for the earliest forms of colorectal cancer.  Routine screening usually begins at age 40.  Direct examination of the colon should be repeated every 5-10 years through 55 years of age. However, you may need to be screened more often if early forms of precancerous polyps or small growths are found. Skin Cancer  Check your skin from head to toe regularly.  Tell your health care provider about any new moles or changes in moles, especially if there is a change in a mole's shape or color.  Also tell your health care provider if you have a mole that is larger than the size of a pencil eraser.  Always use sunscreen. Apply sunscreen liberally and repeatedly throughout the day.  Protect yourself by wearing long sleeves, pants, a wide-brimmed hat, and sunglasses whenever you are outside. HEART DISEASE, DIABETES, AND HIGH BLOOD PRESSURE   Have your blood pressure checked at least every 1-2 years. High blood pressure causes heart disease and increases the risk of stroke.  If  you are between 74 years and 72 years old, ask your health care provider if you should take aspirin to prevent strokes.  Have regular diabetes screenings. This involves taking a blood sample to check your fasting blood sugar level.  If you are at a normal weight and have a low risk for diabetes, have this test once every three years after 55 years of age.  If you are overweight and have a high risk for diabetes, consider being tested at a younger age or more often. PREVENTING INFECTION  Hepatitis B  If you have a higher risk for hepatitis B, you should be screened for this virus. You are considered at high risk for hepatitis B if:  You were born in a country where hepatitis B is common. Ask your health care provider which countries are considered high risk.  Your parents were born in a high-risk country, and you have not been immunized against hepatitis B (hepatitis B vaccine).  You have HIV or AIDS.  You use needles to inject street drugs.  You live with someone who has hepatitis B.  You have had sex with someone who has hepatitis B.  You get hemodialysis treatment.  You take certain medicines for conditions, including cancer, organ transplantation, and autoimmune conditions. Hepatitis C  Blood testing is recommended for:  Everyone born from [birth year] through 1966.  Anyone with known risk factors for hepatitis C. Sexually transmitted infections (STIs)  You should be screened for sexually transmitted infections (STIs) including gonorrhea and chlamydia if:  You are sexually active and are younger than 55 years of age.  You are older than 55 years of age and your health care provider tells you that you are at risk for this type of infection.  Your sexual activity has changed since you were last screened and you are at an increased risk for chlamydia or gonorrhea. Ask your health care provider if you are at risk.  If you do not have HIV, but are at risk, it may be recommended that  you take a prescription medicine daily to prevent HIV infection. This is called pre-exposure prophylaxis (PrEP). You are considered at risk if:  You are sexually active and do not regularly use condoms or know the HIV status of your partner(s).  You take drugs by injection.  You are sexually active with a partner  who has HIV. Talk with your health care provider about whether you are at high risk of being infected with HIV. If you choose to begin PrEP, you should first be tested for HIV. You should then be tested every 3 months for as long as you are taking PrEP.  PREGNANCY   If you are premenopausal and you may become pregnant, ask your health care provider about preconception counseling.  If you may become pregnant, take 400 to 800 micrograms (mcg) of folic acid every day.  If you want to prevent pregnancy, talk to your health care provider about birth control (contraception). OSTEOPOROSIS AND MENOPAUSE   Osteoporosis is a disease in which the bones lose minerals and strength with aging. This can result in serious bone fractures. Your risk for osteoporosis can be identified using a bone density scan.  If you are 8 years of age or older, or if you are at risk for osteoporosis and fractures, ask your health care provider if you should be screened.  Ask your health care provider whether you should take a calcium or vitamin D supplement to lower your risk for osteoporosis.  Menopause may have certain physical symptoms and risks.  Hormone replacement therapy may reduce some of these symptoms and risks. Talk to your health care provider about whether hormone replacement therapy is right for you.  HOME CARE INSTRUCTIONS   Schedule regular health, dental, and eye exams.  Stay current with your immunizations.   Do not use any tobacco products including cigarettes, chewing tobacco, or electronic cigarettes.  If you are pregnant, do not drink alcohol.  If you are breastfeeding, limit how  much and how often you drink alcohol.  Limit alcohol intake to no more than 1 drink per day for nonpregnant women. One drink equals 12 ounces of beer, 5 ounces of wine, or 1 ounces of hard liquor.  Do not use street drugs.  Do not share needles.  Ask your health care provider for help if you need support or information about quitting drugs.  Tell your health care provider if you often feel depressed.  Tell your health care provider if you have ever been abused or do not feel safe at home. Document Released: 05/15/2011 Document Revised: 03/16/2014 Document Reviewed: 10/01/2013 [facility] Patient Information 2015 [place], [place]. This information is not intended to replace advice given to you by your health care provider. Make sure you discuss any questions you have with your health care provider.

## 2015-08-24 LAB — LIPID PANEL
CHOLESTEROL: 226 mg/dL — AB (ref 125–200)
HDL: 65 mg/dL (ref >=46)
LDL Cholesterol: 138 mg/dL — ABNORMAL HIGH (ref <130)
Total CHOL/HDL Ratio: 3.5 Ratio (ref <=5.0)
Triglycerides: 117 mg/dL (ref <150)
VLDL: 23 mg/dL (ref <30)

## 2015-08-24 LAB — COMPREHENSIVE METABOLIC PANEL
ALT: 17 U/L (ref 6–29)
AST: 21 U/L (ref 10–35)
Albumin: 4.5 g/dL (ref 3.6–5.1)
Alkaline Phosphatase: 69 U/L (ref 33–130)
BILIRUBIN TOTAL: 0.6 mg/dL (ref 0.2–1.2)
BUN: 18 mg/dL (ref 7–25)
CO2: 27 mmol/L (ref 20–31)
CREATININE: 0.68 mg/dL (ref 0.50–1.05)
Calcium: 10 mg/dL (ref 8.6–10.4)
Chloride: 105 mmol/L (ref 98–110)
Glucose, Bld: 83 mg/dL (ref 65–99)
Potassium: 4 mmol/L (ref 3.5–5.3)
SODIUM: 140 mmol/L (ref 135–146)
TOTAL PROTEIN: 6.9 g/dL (ref 6.1–8.1)

## 2015-08-24 LAB — URINALYSIS W MICROSCOPIC + REFLEX CULTURE
Bacteria, UA: NONE SEEN [HPF]
Bilirubin Urine: NEGATIVE
CASTS: NONE SEEN [LPF]
CRYSTALS: NONE SEEN [HPF]
Glucose, UA: NEGATIVE
Hgb urine dipstick: NEGATIVE
Ketones, ur: NEGATIVE
Leukocytes, UA: NEGATIVE
Nitrite: NEGATIVE
Protein, ur: NEGATIVE
RBC / HPF: NONE SEEN RBC/HPF (ref <=2)
SPECIFIC GRAVITY, URINE: 1.006 (ref 1.001–1.035)
Squamous Epithelial / LPF: NONE SEEN [HPF] (ref <=5)
WBC, UA: NONE SEEN WBC/HPF (ref <=5)
YEAST: NONE SEEN [HPF]
pH: 6 (ref 5.0–8.0)

## 2015-08-24 LAB — TSH: TSH: 1.583 u[IU]/mL (ref 0.350–4.500)

## 2015-08-24 LAB — VITAMIN D 25 HYDROXY (VIT D DEFICIENCY, FRACTURES): Vit D, 25-Hydroxy: 34 ng/mL (ref 30–100)

## 2015-08-26 LAB — CYTOLOGY - PAP

## 2015-09-01 ENCOUNTER — Other Ambulatory Visit: Payer: Self-pay | Admitting: Gynecology

## 2015-09-01 DIAGNOSIS — E7800 Pure hypercholesterolemia, unspecified: Secondary | ICD-10-CM

## 2015-09-08 ENCOUNTER — Other Ambulatory Visit: Payer: Self-pay | Admitting: Gynecology

## 2015-09-08 DIAGNOSIS — E7800 Pure hypercholesterolemia, unspecified: Secondary | ICD-10-CM

## 2016-03-14 ENCOUNTER — Encounter: Payer: Self-pay | Admitting: Internal Medicine

## 2016-03-14 ENCOUNTER — Ambulatory Visit (INDEPENDENT_AMBULATORY_CARE_PROVIDER_SITE_OTHER)
Admission: RE | Admit: 2016-03-14 | Discharge: 2016-03-14 | Disposition: A | Discharge status: Home / Self Care | Payer: BLUE CROSS/BLUE SHIELD | Source: Ambulatory Visit | Attending: Internal Medicine | Admitting: Internal Medicine

## 2016-03-14 ENCOUNTER — Ambulatory Visit (INDEPENDENT_AMBULATORY_CARE_PROVIDER_SITE_OTHER): Payer: BLUE CROSS/BLUE SHIELD | Admitting: Internal Medicine

## 2016-03-14 VITALS — BP 134/86 | HR 60 | Temp 98.0°F | Resp 16 | Ht 70.0 in | Wt 161.0 lb

## 2016-03-14 DIAGNOSIS — Z23 Encounter for immunization: Secondary | ICD-10-CM

## 2016-03-14 DIAGNOSIS — E7800 Pure hypercholesterolemia, unspecified: Secondary | ICD-10-CM

## 2016-03-14 DIAGNOSIS — M25572 Pain in left ankle and joints of left foot: Secondary | ICD-10-CM

## 2016-03-14 DIAGNOSIS — Z0001 Encounter for general adult medical examination with abnormal findings: Secondary | ICD-10-CM

## 2016-03-14 DIAGNOSIS — Z1159 Encounter for screening for other viral diseases: Secondary | ICD-10-CM

## 2016-03-14 DIAGNOSIS — Z0000 Encounter for general adult medical examination without abnormal findings: Secondary | ICD-10-CM

## 2016-03-14 NOTE — Patient Instructions (Addendum)
Have your xray today and return for blood work when you want.   Test(s) ordered today. Your results will be released to MyChart (or called to you) after review, usually within 72hours after test completion. If any changes need to be made, you will be notified at that same time.  All other Health Maintenance issues reviewed.   All recommended immunizations and age-appropriate screenings are up-to-date or discussed.  tdap vaccine administered today.   Medications reviewed and updated.  No changes recommended at this time.

## 2016-03-14 NOTE — Progress Notes (Signed)
Pre visit review using our clinic review tool, if applicable. No additional management support is needed unless otherwise documented below in the visit note. 

## 2016-03-14 NOTE — Assessment & Plan Note (Signed)
Elevated cholesterol, past-improved with lifestyle changes Recheck lipid panel

## 2016-03-14 NOTE — Progress Notes (Signed)
Subjective:    Patient ID: [name], female    DOB: July 05, 1961, 55 y.o.   MRN: [id number]  HPI She is here to establish with a new pcp.  She is here for a physical exam.   Left ankle swelling and pain:  She has left ankle swelling and pain.  She denies any injury or accident.  She has been using the bunion sock Intermittently, but does not use it consistently.  She used it the end of last week and forgot that she had an on and slept with it on. Within the next couple of days she developed medial and lateral left ankle swelling and pain. She is on her feet a lot during the day and that made it worse. She took advil, elevated her ankle and iced it.  That has helped, but she is still experiencing swelling and pain.  She has noticed that her arms fall asleep when she sleeps.  Massages were helping and she had been done regularly. She denies any significant tingling or numbness during the day, except occasionally when she is driving.  She denies any arm or hands weakness. She does notice the numbness/tingling more if she is in a certain position at night.   Medications and allergies reviewed with patient and updated if appropriate.  Patient Active Problem List   Diagnosis Date Noted  . Hypercholesteremia     No current outpatient prescriptions on file prior to visit.   No current facility-administered medications on file prior to visit.    Past Medical History  Diagnosis Date  . Hypercholesteremia   . CRP elevated   . Cervical dysplasia     Past Surgical History  Procedure Laterality Date  . Cesarean section    . Oophorectomy      ? RSO, DERMOID  . Cervical biopsy  w/ loop electrode excision  10/04    LGSIL    Social History   Social History  . Marital Status: Married    Spouse Name: N/A  . Number of Children: N/A  . Years of Education: N/A   Social History Main Topics  . Smoking status: Never Smoker   . Smokeless tobacco: Never Used  . Alcohol Use: 4.2 oz/week      7 Standard drinks or equivalent per week  . Drug Use: No  . Sexual Activity: Yes    Birth Control/ Protection:      Comment: 1st intercourse 55 yo-Fewer than 5 partners   Other Topics Concern  . None   Social History Narrative   Exercise - regularl    Family History  Problem Relation Age of Onset  . Cancer Mother     ov/ut  . Breast cancer Mother 7  . Hypertension Mother   . Heart disease Father   . Hypertension Father   . Breast cancer Sister     Age 12's  . Cancer Sister     ov/ut  . Diabetes Brother   . Kidney disease Brother     Review of Systems  Constitutional: Negative for fever, chills, appetite change and fatigue.  HENT: Negative for hearing loss.   Eyes: Negative for visual disturbance.  Respiratory: Negative for shortness of breath and wheezing.   Cardiovascular: Negative for chest pain, palpitations and leg swelling.  Gastrointestinal: Negative for nausea, abdominal pain, diarrhea, constipation and blood in stool.       No gerd  Genitourinary: Negative for dysuria and hematuria.  Musculoskeletal: Negative for back pain, arthralgias  and neck pain.  Neurological: Negative for dizziness, light-headedness and headaches.  Psychiatric/Behavioral: Negative for dysphoric mood. The patient is not nervous/anxious.        Objective:   Filed Vitals:   03/14/16 1305  BP: 134/86  Pulse: 60  Temp: 98 F (36.7 C)  Resp: 16   Filed Weights   03/14/16 1305  Weight: 161 lb (73.029 kg)   Body mass index is 23.1 kg/(m^2).   Physical Exam Constitutional: She appears well-developed and well-nourished. No distress.  HENT:  Head: Normocephalic and atraumatic.  Right Ear: External ear normal. Normal ear canal and TM Left Ear: External ear normal.  Normal ear canal and TM Mouth/Throat: Oropharynx is clear and moist.  Eyes: Conjunctivae and EOM are normal.  Neck: Neck supple. No tracheal deviation present. No thyromegaly present.  No carotid bruit   Cardiovascular: Normal rate, regular rhythm and normal heart sounds.   No murmur heard.  No edema. Pulmonary/Chest: Effort normal and breath sounds normal. No respiratory distress. She has no wheezes. She has no rales.  Breast: deferred to Gyn, Up-to-date  Abdominal: Soft. She exhibits no distension. There is no tenderness.  Lymphadenopathy: She has no cervical adenopathy.  Musculoskeletal: Left medial and lateral ankle swelling with tenderness with palpation of plantar surface of heel and posterior to the medial malleolus, no deformity, increased pain with inversion/eversion/flexion/extension of ankle, no Achilles tenderness or swelling, normal sensation in foot, normal pulses and foot  Skin: Skin is warm and dry. She is not diaphoretic.  Psychiatric: She has a normal mood and affect. Her behavior is normal.       Assessment & Plan:   Physical exam: Screening blood work ordered Immunizations tdap Today, other immunizations up-to-date  Colonoscopy-discussed colonoscopy and cologaurd. She has not had either and will think about it. Stressed the importance of colon cancer screening  Mammogram-up-to-date  Gyn Up to date  Dexa-never had, but likely low risk. Advised having one done in the next few years  Eye exams-up-to-date  EKG-given good physical fitness and lack of symptoms will hold off from having an EKG today  Exercise-exercising regularly  Weight-normal BMI  Skin -no concerns, does have some age spots. Advised monitoring skin for changes  Substance abuse - No evidence of abuse  Left ankle pain and swelling: Acute No obvious injury Symptoms likely related to using the bunion sling Possible inflammation of ligament/tendinitis Unlikely stress fracture, but this is a possibility-we will check x-ray today Continue ice, elevation and Advil     Follow-up annually, sooner if needed  See Problem List for Assessment and Plan of chronic medical problems.

## 2016-03-15 ENCOUNTER — Telehealth: Payer: Self-pay | Admitting: Internal Medicine

## 2016-03-15 NOTE — Telephone Encounter (Signed)
Spoke with pt to inform.  

## 2016-03-15 NOTE — Telephone Encounter (Signed)
Pt request x-ray result. Please call her back [phone] Sherilyn Dacosta( okey to leave detail massage if no answer).

## 2016-03-21 ENCOUNTER — Other Ambulatory Visit (INDEPENDENT_AMBULATORY_CARE_PROVIDER_SITE_OTHER): Payer: BLUE CROSS/BLUE SHIELD

## 2016-03-21 DIAGNOSIS — Z1159 Encounter for screening for other viral diseases: Secondary | ICD-10-CM

## 2016-03-21 DIAGNOSIS — Z0000 Encounter for general adult medical examination without abnormal findings: Secondary | ICD-10-CM

## 2016-03-21 LAB — LIPID PANEL
CHOL/HDL RATIO: 4
Cholesterol: 242 mg/dL — ABNORMAL HIGH (ref 0–200)
HDL: 58.2 mg/dL (ref >39.00)
LDL CALC: 166 mg/dL — AB (ref 0–99)
NONHDL: 183.7
Triglycerides: 90 mg/dL (ref 0.0–149.0)
VLDL: 18 mg/dL (ref 0.0–40.0)

## 2016-03-21 LAB — CBC WITH DIFFERENTIAL/PLATELET
BASOS ABS: 0 10*3/uL (ref 0.0–0.1)
Basophils Relative: 0.7 % (ref 0.0–3.0)
EOS ABS: 0.4 10*3/uL (ref 0.0–0.7)
Eosinophils Relative: 8.1 % — ABNORMAL HIGH (ref 0.0–5.0)
HEMATOCRIT: 43.9 % (ref 36.0–46.0)
Hemoglobin: 14.9 g/dL (ref 12.0–15.0)
LYMPHS PCT: 43.9 % (ref 12.0–46.0)
Lymphs Abs: 2.3 10*3/uL (ref 0.7–4.0)
MCHC: 33.8 g/dL (ref 30.0–36.0)
MCV: 89.4 fl (ref 78.0–100.0)
MONO ABS: 0.4 10*3/uL (ref 0.1–1.0)
Monocytes Relative: 8.2 % (ref 3.0–12.0)
NEUTROS ABS: 2.1 10*3/uL (ref 1.4–7.7)
NEUTROS PCT: 39.1 % — AB (ref 43.0–77.0)
PLATELETS: 281 10*3/uL (ref 150.0–400.0)
RBC: 4.91 Mil/uL (ref 3.87–5.11)
RDW: 13.4 % (ref 11.5–15.5)
WBC: 5.3 10*3/uL (ref 4.0–10.5)

## 2016-03-21 LAB — COMPREHENSIVE METABOLIC PANEL
ALT: 18 U/L (ref 0–35)
AST: 20 U/L (ref 0–37)
Albumin: 4.4 g/dL (ref 3.5–5.2)
Alkaline Phosphatase: 67 U/L (ref 39–117)
BILIRUBIN TOTAL: 0.6 mg/dL (ref 0.2–1.2)
BUN: 18 mg/dL (ref 6–23)
CALCIUM: 10.1 mg/dL (ref 8.4–10.5)
CHLORIDE: 105 meq/L (ref 96–112)
CO2: 28 meq/L (ref 19–32)
CREATININE: 0.8 mg/dL (ref 0.40–1.20)
GFR: 78.77 mL/min (ref >60.00)
Glucose, Bld: 101 mg/dL — ABNORMAL HIGH (ref 70–99)
Potassium: 4.8 mEq/L (ref 3.5–5.1)
SODIUM: 141 meq/L (ref 135–145)
Total Protein: 7.4 g/dL (ref 6.0–8.3)

## 2016-03-21 LAB — TSH: TSH: 2.34 u[IU]/mL (ref 0.35–4.50)

## 2016-03-22 LAB — HEPATITIS C ANTIBODY: HCV AB: NEGATIVE

## 2016-03-24 LAB — VITAMIN D 1,25 DIHYDROXY
Vitamin D 1, 25 (OH)2 Total: 45 pg/mL (ref 18–72)
Vitamin D3 1, 25 (OH)2: 45 pg/mL

## 2016-03-25 ENCOUNTER — Encounter: Payer: Self-pay | Admitting: Internal Medicine

## 2016-04-12 ENCOUNTER — Telehealth: Payer: Self-pay | Admitting: Internal Medicine

## 2016-04-12 NOTE — Telephone Encounter (Signed)
Pt called in and would like for some one give her a call with her lab results

## 2016-04-12 NOTE — Telephone Encounter (Signed)
Spoke with pt to inform for results. Changed MyChart password for pt.

## 2016-05-10 ENCOUNTER — Encounter: Payer: Self-pay | Admitting: Internal Medicine

## 2016-05-10 NOTE — Telephone Encounter (Signed)
Please start cologuard for her.

## 2016-06-08 LAB — COLOGUARD

## 2016-06-09 LAB — COLOGUARD: Cologuard: NEGATIVE

## 2016-08-23 ENCOUNTER — Encounter: Payer: BLUE CROSS/BLUE SHIELD | Admitting: Gynecology

## 2016-08-30 ENCOUNTER — Encounter: Payer: Self-pay | Admitting: *Deleted

## 2016-08-30 ENCOUNTER — Telehealth: Payer: Self-pay | Admitting: *Deleted

## 2016-08-30 ENCOUNTER — Ambulatory Visit (INDEPENDENT_AMBULATORY_CARE_PROVIDER_SITE_OTHER): Payer: BLUE CROSS/BLUE SHIELD | Admitting: Gynecology

## 2016-08-30 ENCOUNTER — Encounter: Payer: Self-pay | Admitting: Gynecology

## 2016-08-30 VITALS — BP 116/70 | Ht 69.5 in | Wt 165.0 lb

## 2016-08-30 DIAGNOSIS — N9419 Other specified dyspareunia: Secondary | ICD-10-CM

## 2016-08-30 DIAGNOSIS — Z01411 Encounter for gynecological examination (general) (routine) with abnormal findings: Secondary | ICD-10-CM

## 2016-08-30 DIAGNOSIS — N951 Menopausal and female climacteric states: Secondary | ICD-10-CM | POA: Diagnosis not present

## 2016-08-30 DIAGNOSIS — Z803 Family history of malignant neoplasm of breast: Secondary | ICD-10-CM | POA: Diagnosis not present

## 2016-08-30 NOTE — Telephone Encounter (Signed)
Referral placed at cancer center they will contact pt to schedule. 

## 2016-08-30 NOTE — Progress Notes (Signed)
[name] 09-Jan-1961 [id number]        55 y.o.  [id number]  for annual exam.  Several issues noted below.  Past medical history,surgical history, problem list, medications, allergies, family history and social history were all reviewed and documented as reviewed in the EPIC chart.  ROS:  Performed with pertinent positives and negatives included in the history, assessment and plan.   Additional significant findings :  None   Exam: Kennon Portela assistant Vitals:   08/30/16 1010  BP: 116/70  Weight: 165 lb (74.8 kg)  Height: 5' 9.5" (1.765 m)   Body mass index is 24.02 kg/m.  General appearance:  Normal affect, orientation and appearance. Skin: Grossly normal HEENT: Without gross lesions.  No cervical or supraclavicular adenopathy. Thyroid normal.  Lungs:  Clear without wheezing, rales or rhonchi Cardiac: RR, without RMG Abdominal:  Soft, nontender, without masses, guarding, rebound, organomegaly or hernia Breasts:  Examined lying and sitting without masses, retractions, discharge or axillary adenopathy. Pelvic:  Ext, BUS, Vagina with atrophic changes  Cervix with atrophic changes  Uterus anteverted, normal size, shape and contour, midline and mobile nontender   Adnexa without masses or tenderness    Anus and perineum normal   Rectovaginal normal sphincter tone without palpated masses or tenderness.    Assessment/Plan:  55 y.o. [id number] female for annual exam.   1. Postmenopausal symptoms. Patient over the last year has developed increasing hot flushes and night sweats. Also is having tearing burning type discomfort with intercourse. Has tried OTC lubricants without success. I reviewed options with the patient to include OTC products both for the menopausal symptoms and dyspareunia, nonhormonal pharmacologic such as Effexor and HRT which would address both issues. I discussed the most current names 2017 guidelines for HRT to include possible benefits of symptom relief  cardiovascular bone health with early initiation as well as risks to include increased risk of thrombosis such as stroke heart attack DVT and possible breast cancer. We discussed her family history of breast cancer as in #2 and we will await initiation of HRT pending that evaluation. 2. Strong family history of breast cancer. Patient's mother died of breast cancer in her early 30s and her sister developed breast cancer in her 30s. She does not believe anyone was tested genetically.  Strongly recommended patient be evaluated and genetically tested. Benefits if positive from a prophylactic surgery standpoint or increased surveillance standpoint as well as from other family member testing such as her children as discussed. The issues of prophylactic mastectomy and salpingo-oophorectomy reviewed. Patient very much wants to be evaluated and tested. She knows to call my office if she does not hear from the genetic counselors to arrange an appointment within 1-2 weeks. Mammography due now and she has it scheduled for next week. SBE monthly reviewed. 3. Pap smear 2016. No Pap smear done today. 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 Normal Pap smears since. Plan repeat Pap smear at 3 year interval. 4. Colonoscopy never. Is being screened to her primary physician's office will follow up with them in reference to this. 5. DEXA never. Will plan further into menopause. Increased calcium vitamin D. 6. Health maintenance. No routine lab work done as this is done at her primary physician's office. Follow up for genetic screening and subsequent HRT discussion. Follow up in one year for annual exam  15 minutes of my time in excess of her breast and pelvic exam was spent in direct face to face counseling and coordination of  care in regards to her menopausal symptoms, dyspareunia, HRT risk versus benefit discussion as well as her family history of breast cancer, genetic counseling discussion and possible scenarios with prophylactic  treatments or surveillance.    Dara LordsFONTAINE,TIMOTHY P MD, 11:30 AM 08/30/2016

## 2016-08-30 NOTE — Patient Instructions (Signed)
Office will call you to arrange for the genetic counseling. Call my office if you do not hear from them within 1-2 weeks.  We will discuss hormone replacement therapy after testing.  You may obtain a copy of any labs that were done today by logging onto MyChart as outlined in the instructions provided with your AVS (after visit summary). The office will not call with normal lab results but certainly if there are any significant abnormalities then we will contact you.   Health Maintenance Adopting a healthy lifestyle and getting preventive care can go a long way to promote health and wellness. Talk with your health care provider about what schedule of regular examinations is right for you. This is a good chance for you to check in with your provider about disease prevention and staying healthy. In between checkups, there are plenty of things you can do on your own. Experts have done a lot of research about which lifestyle changes and preventive measures are most likely to keep you healthy. Ask your health care provider for more information. WEIGHT AND DIET  Eat a healthy diet  Be sure to include plenty of vegetables, fruits, low-fat dairy products, and lean protein.  Do not eat a lot of foods high in solid fats, added sugars, or salt.  Get regular exercise. This is one of the most important things you can do for your health.  Most adults should exercise for at least 150 minutes each week. The exercise should increase your heart rate and make you sweat (moderate-intensity exercise).  Most adults should also do strengthening exercises at least twice a week. This is in addition to the moderate-intensity exercise.  Maintain a healthy weight  Body mass index (BMI) is a measurement that can be used to identify possible weight problems. It estimates body fat based on height and weight. Your health care provider can help determine your BMI and help you achieve or maintain a healthy weight.  For  females 17 years of age and older:   A BMI below 18.5 is considered underweight.  A BMI of 18.5 to 24.9 is normal.  A BMI of 25 to 29.9 is considered overweight.  A BMI of 30 and above is considered obese.  Watch levels of cholesterol and blood lipids  You should start having your blood tested for lipids and cholesterol at 56 years of age, then have this test every 5 years.  You may need to have your cholesterol levels checked more often if:  Your lipid or cholesterol levels are high.  You are older than 56 years of age.  You are at high risk for heart disease.  CANCER SCREENING   Lung Cancer  Lung cancer screening is recommended for adults 39-34 years old who are at high risk for lung cancer because of a history of smoking.  A yearly low-dose CT scan of the lungs is recommended for people who:  Currently smoke.  Have quit within the past 15 years.  Have at least a 30-pack-year history of smoking. A pack year is smoking an average of one pack of cigarettes a day for 1 year.  Yearly screening should continue until it has been 15 years since you quit.  Yearly screening should stop if you develop a health problem that would prevent you from having lung cancer treatment.  Breast Cancer  Practice breast self-awareness. This means understanding how your breasts normally appear and feel.  It also means doing regular breast self-exams. Let your health care  provider know about any changes, no matter how small.  If you are in your 20s or 30s, you should have a clinical breast exam (CBE) by a health care provider every 1-3 years as part of a regular health exam.  If you are 63 or older, have a CBE every year. Also consider having a breast X-ray (mammogram) every year.  If you have a family history of breast cancer, talk to your health care provider about genetic screening.  If you are at high risk for breast cancer, talk to your health care provider about having an MRI and  a mammogram every year.  Breast cancer gene (BRCA) assessment is recommended for women who have family members with BRCA-related cancers. BRCA-related cancers include:  Breast.  Ovarian.  Tubal.  Peritoneal cancers.  Results of the assessment will determine the need for genetic counseling and BRCA1 and BRCA2 testing. Cervical Cancer Routine pelvic examinations to screen for cervical cancer are no longer recommended for nonpregnant women who are considered low risk for cancer of the pelvic organs (ovaries, uterus, and vagina) and who do not have symptoms. A pelvic examination may be necessary if you have symptoms including those associated with pelvic infections. Ask your health care provider if a screening pelvic exam is right for you.   The Pap test is the screening test for cervical cancer for women who are considered at risk.  If you had a hysterectomy for a problem that was not cancer or a condition that could lead to cancer, then you no longer need Pap tests.  If you are older than 65 years, and you have had normal Pap tests for the past 10 years, you no longer need to have Pap tests.  If you have had past treatment for cervical cancer or a condition that could lead to cancer, you need Pap tests and screening for cancer for at least 20 years after your treatment.  If you no longer get a Pap test, assess your risk factors if they change (such as having a new sexual partner). This can affect whether you should start being screened again.  Some women have medical problems that increase their chance of getting cervical cancer. If this is the case for you, your health care provider may recommend more frequent screening and Pap tests.  The human papillomavirus (HPV) test is another test that may be used for cervical cancer screening. The HPV test looks for the virus that can cause cell changes in the cervix. The cells collected during the Pap test can be tested for HPV.  The HPV test can  be used to screen women 14 years of age and older. Getting tested for HPV can extend the interval between normal Pap tests from three to five years.  An HPV test also should be used to screen women of any age who have unclear Pap test results.  After 56 years of age, women should have HPV testing as often as Pap tests.  Colorectal Cancer  This type of cancer can be detected and often prevented.  Routine colorectal cancer screening usually begins at 56 years of age and continues through 56 years of age.  Your health care provider may recommend screening at an earlier age if you have risk factors for colon cancer.  Your health care provider may also recommend using home test kits to check for hidden blood in the stool.  A small camera at the end of a tube can be used to examine your colon  directly (sigmoidoscopy or colonoscopy). This is done to check for the earliest forms of colorectal cancer.  Routine screening usually begins at age 57.  Direct examination of the colon should be repeated every 5-10 years through 56 years of age. However, you may need to be screened more often if early forms of precancerous polyps or small growths are found. Skin Cancer  Check your skin from head to toe regularly.  Tell your health care provider about any new moles or changes in moles, especially if there is a change in a mole's shape or color.  Also tell your health care provider if you have a mole that is larger than the size of a pencil eraser.  Always use sunscreen. Apply sunscreen liberally and repeatedly throughout the day.  Protect yourself by wearing long sleeves, pants, a wide-brimmed hat, and sunglasses whenever you are outside. HEART DISEASE, DIABETES, AND HIGH BLOOD PRESSURE   Have your blood pressure checked at least every 1-2 years. High blood pressure causes heart disease and increases the risk of stroke.  If you are between 21 years and 50 years old, ask your health care provider if  you should take aspirin to prevent strokes.  Have regular diabetes screenings. This involves taking a blood sample to check your fasting blood sugar level.  If you are at a normal weight and have a low risk for diabetes, have this test once every three years after 56 years of age.  If you are overweight and have a high risk for diabetes, consider being tested at a younger age or more often. PREVENTING INFECTION  Hepatitis B  If you have a higher risk for hepatitis B, you should be screened for this virus. You are considered at high risk for hepatitis B if:  You were born in a country where hepatitis B is common. Ask your health care provider which countries are considered high risk.  Your parents were born in a high-risk country, and you have not been immunized against hepatitis B (hepatitis B vaccine).  You have HIV or AIDS.  You use needles to inject street drugs.  You live with someone who has hepatitis B.  You have had sex with someone who has hepatitis B.  You get hemodialysis treatment.  You take certain medicines for conditions, including cancer, organ transplantation, and autoimmune conditions. Hepatitis C  Blood testing is recommended for:  Everyone born from [birth year] through 1966.  Anyone with known risk factors for hepatitis C. Sexually transmitted infections (STIs)  You should be screened for sexually transmitted infections (STIs) including gonorrhea and chlamydia if:  You are sexually active and are younger than 56 years of age.  You are older than 56 years of age and your health care provider tells you that you are at risk for this type of infection.  Your sexual activity has changed since you were last screened and you are at an increased risk for chlamydia or gonorrhea. Ask your health care provider if you are at risk.  If you do not have HIV, but are at risk, it may be recommended that you take a prescription medicine daily to prevent HIV infection. This is  called pre-exposure prophylaxis (PrEP). You are considered at risk if:  You are sexually active and do not regularly use condoms or know the HIV status of your partner(s).  You take drugs by injection.  You are sexually active with a partner who has HIV. Talk with your health care provider about whether you are  at high risk of being infected with HIV. If you choose to begin PrEP, you should first be tested for HIV. You should then be tested every 3 months for as long as you are taking PrEP.  PREGNANCY   If you are premenopausal and you may become pregnant, ask your health care provider about preconception counseling.  If you may become pregnant, take 400 to 800 micrograms (mcg) of folic acid every day.  If you want to prevent pregnancy, talk to your health care provider about birth control (contraception). OSTEOPOROSIS AND MENOPAUSE   Osteoporosis is a disease in which the bones lose minerals and strength with aging. This can result in serious bone fractures. Your risk for osteoporosis can be identified using a bone density scan.  If you are 16 years of age or older, or if you are at risk for osteoporosis and fractures, ask your health care provider if you should be screened.  Ask your health care provider whether you should take a calcium or vitamin D supplement to lower your risk for osteoporosis.  Menopause may have certain physical symptoms and risks.  Hormone replacement therapy may reduce some of these symptoms and risks. Talk to your health care provider about whether hormone replacement therapy is right for you.  HOME CARE INSTRUCTIONS   Schedule regular health, dental, and eye exams.  Stay current with your immunizations.   Do not use any tobacco products including cigarettes, chewing tobacco, or electronic cigarettes.  If you are pregnant, do not drink alcohol.  If you are breastfeeding, limit how much and how often you drink alcohol.  Limit alcohol intake to no more  than 1 drink per day for nonpregnant women. One drink equals 12 ounces of beer, 5 ounces of wine, or 1 ounces of hard liquor.  Do not use street drugs.  Do not share needles.  Ask your health care provider for help if you need support or information about quitting drugs.  Tell your health care provider if you often feel depressed.  Tell your health care provider if you have ever been abused or do not feel safe at home. Document Released: 05/15/2011 Document Revised: 03/16/2014 Document Reviewed: 10/01/2013 [employer] [facility] [employer] [facility] [employer] Patient Information 2015 [place], [place]. This information is not intended to replace advice given to you by your health care provider. Make sure you discuss any questions you have with your health care provider.

## 2016-08-30 NOTE — Telephone Encounter (Signed)
-----   Message from Dara Lordsimothy P Fontaine, MD sent at 08/30/2016 10:47 AM EDT ----- Schedule an appointment with the oncology genetic counselor reference maternal history of breast cancer in her 2450s, sister history of breast cancer in her 30s

## 2016-08-31 ENCOUNTER — Other Ambulatory Visit: Payer: Self-pay | Admitting: Gynecology

## 2016-08-31 DIAGNOSIS — Z1231 Encounter for screening mammogram for malignant neoplasm of breast: Secondary | ICD-10-CM

## 2016-09-11 ENCOUNTER — Telehealth: Payer: Self-pay | Admitting: Genetic Counselor

## 2016-09-11 ENCOUNTER — Encounter: Payer: Self-pay | Admitting: Genetic Counselor

## 2016-09-11 NOTE — Telephone Encounter (Signed)
10/19/16@10  am with [name]

## 2016-09-11 NOTE — Telephone Encounter (Signed)
Pt confirmed appt, verified demo and insurance, mailed pt letter, in basket referring provider appt date/time. °

## 2016-09-12 ENCOUNTER — Ambulatory Visit
Admission: RE | Admit: 2016-09-12 | Discharge: 2016-09-12 | Disposition: A | Discharge status: Home / Self Care | Payer: BLUE CROSS/BLUE SHIELD | Source: Ambulatory Visit | Attending: Gynecology | Admitting: Gynecology

## 2016-09-12 DIAGNOSIS — Z1231 Encounter for screening mammogram for malignant neoplasm of breast: Secondary | ICD-10-CM

## 2016-10-17 ENCOUNTER — Ambulatory Visit (INDEPENDENT_AMBULATORY_CARE_PROVIDER_SITE_OTHER): Payer: BLUE CROSS/BLUE SHIELD | Admitting: Nurse Practitioner

## 2016-10-17 ENCOUNTER — Telehealth: Payer: Self-pay | Admitting: Genetic Counselor

## 2016-10-17 ENCOUNTER — Encounter: Payer: Self-pay | Admitting: Nurse Practitioner

## 2016-10-17 ENCOUNTER — Ambulatory Visit: Payer: BLUE CROSS/BLUE SHIELD | Admitting: Nurse Practitioner

## 2016-10-17 VITALS — BP 110/74 | HR 74 | Temp 98.1°F | Ht 70.0 in | Wt 165.0 lb

## 2016-10-17 DIAGNOSIS — S76111A Strain of right quadriceps muscle, fascia and tendon, initial encounter: Secondary | ICD-10-CM | POA: Diagnosis not present

## 2016-10-17 DIAGNOSIS — M79651 Pain in right thigh: Secondary | ICD-10-CM

## 2016-10-17 MED ORDER — MELOXICAM 7.5 MG PO TABS: 7.5000 mg | ORAL_TABLET | Freq: Every day | ORAL | 0 refills | Status: DC

## 2016-10-17 NOTE — Patient Instructions (Addendum)
Patient is to refrain from prolong walking and rowing exercise. She may do exercises below, cycling and elliptical. Use cold compress after exercise. Wear knee compression sleeve during day and off at night.  Quadriceps Tendon Tear or Disruption Rehab Ask your health care provider which exercises are safe for you. Do exercises exactly as told by your health care provider and adjust them as directed. It is normal to feel mild stretching, pulling, tightness, or discomfort as you do these exercises, but you should stop right away if you feel sudden pain or your pain gets worse.Do not begin these exercises until told by your health care provider. Stretching and range of motion exercises These exercises warm up your muscles and joints and improve the movement and flexibility of your thigh. These exercises can also help to relieve pain, numbness, and tingling. Exercise A: Knee flexion, active 1. Lie on your back with both knees straight. If this causes back discomfort, bend your healthy knee so your foot is flat on the floor. 2. Slowly slide your left / right heel back toward your buttocks until you feel a gentle stretch in the front of your knee or thigh. 3. Hold for __________ seconds. 4. Slowly slide your left / right heel back to the starting position. Repeat __________ times. Complete this exercise __________ times a day. Exercise B: Knee extension, passive 1. Sit with your left / right heel propped on a chair, a coffee table, or a footstool. Do not have anything under your knee to support it. 2. Allow your leg muscles to relax, letting gravity straighten out your knee. You should feel a stretch behind your left / right knee. 3. If told by your health care provider, deepen the stretch by placing a __________ weight on your thigh, just above your kneecap. 4. Hold this position for __________ seconds. Repeat __________ times. Complete this exercise __________ times a day. Strengthening  exercises These exercises build strength and endurance in your thigh. Endurance is the ability to use your muscles for a long time, even after your muscles get tired. Do these exercises while wearing your brace if told by your health care provider. Exercise C: Quadriceps, isometric 1. Lie on your back with your left / right leg extended and your other knee bent. If told by your health care provider, put a small pillow or rolled towel under your extended knee. 2. Gradually tense the muscles in the front of your left / right thigh. You should see your kneecap slide up toward your hip or see increased dimpling just above the knee. This motion will push the back of your knee down toward the surface that is under it. 3. For __________ seconds, hold the muscle as tight as you can without increasing your pain. 4. Relax the muscles slowly and completely in between repetitions. Repeat __________ times. Complete this exercise __________ times a day. Exercise D: Straight leg raises (hip flexors and quadriceps) 1. Lie on your back with your left / right leg extended and your other knee bent. 2. Tense the muscles in the front of your left / right thigh. You should see your kneecap slide up or see increased dimpling just above the knee. 3. Keep these muscles tight while you raise your leg 4-6 inches (10-15 cm) off the floor. Do not let your knee bend. 4. Hold the position for __________ seconds. 5. Keep the muscles tense and keep your knee straight as you lower your leg. 6. Relax the muscles slowly and completely after  each repetition. Repeat __________ times. Complete this exercise __________ times a day. Exercise E: Wall slides (knee and hip extensors) 1. Lean your back against a smooth wall or door and walk your feet out 18-24 inches (46-61 cm) from it. Place your feet hip-width apart. 2. Slowly slide down the wall or door until your knees bend __________ degrees. Do not bend your knee farther than told by  your health care provider. Keep your knees over your heels, not over your toes. Keep your knees in line with your hips. 3. Hold for __________ seconds. Stand up to rest for __________ seconds in between repetitions. Repeat __________ times. Complete this exercise __________ times a day. This information is not intended to replace advice given to you by your health care provider. Make sure you discuss any questions you have with your health care provider. Document Released: 10/30/2005 Document Revised: 07/04/2016 Document Reviewed: 10/12/2015 Elsevier Interactive Patient Education  2017 [employer] Quadriceps Tendon Tear or Disruption A quadriceps tendon tear or disruption is a partial or complete tear of the tendon between the quadriceps muscles and the kneecap (patella). Tendons connect muscles to bone. The quadriceps muscles are located on the front of the thigh and are primarily used in straightening the knee. With a partial tear, the tendon is overstretched and some of the fibers are frayed. With a complete tear, the quadriceps muscle is detached from the kneecap. This is very rare. What are the causes? This condition can be caused by trauma, such as:  A deep cut on your thigh that injures the tendon.  Falling on your knee, which may result in breaking your patella. The condition can also occur if you land from a jump flat on your foot with your knee bent, causing a quick and forceful tightening (contraction) of your quadriceps. What increases the risk? The following factors may make you more likely to develop this condition:  Participating in:  Activities that involve jumping, such as basketball.  Activities in which your knee muscles contract suddenly and forcefully, such as doing jumps or moguls in downhill skiing.  Having a weakened tendon from:  Prolonged (chronic) quadriceps tendinitis.  Long periods of not moving your knee (immobilization).  Repeated corticosteroid  injections into the quadriceps tendon.  Medical conditions such as diabetes, lupus, or rheumatoid arthritis.  Degeneration over time. Most quadriceps tendon tears occur in males over 67 years of age. What are the signs or symptoms? Symptoms of this condition include:  Hearing a "pop" sound or feeling a tear above your patella at the time of injury.  Pain and tenderness over your thigh. The pain may get worse when you use the quadriceps muscles.  Bruising.  Difficulty walking, or a feeling of the knee giving way.  A sagging kneecap or an indentation above your kneecap.  Not being able to straighten your knee. How is this diagnosed? This condition may be diagnosed based on:  Your symptoms and medical history.  A physical exam. During the exam, your health care provider will:  Feel the area above your kneecap.  Test the motion and strength of your knee.  Imaging tests to rule out other conditions and to confirm the diagnosis. These may include:  X-rays to check for a bone injury, such as a fracture.  Ultrasound or MRI to look at the muscles and tendons around your knee. How is this treated? This condition may be treated with:  Medicines to help reduce pain and inflammation.  RICE therapy. This includes  resting, icing, applying compression, and raising (elevating) the injured area.  A knee brace (immobilizer) to keep the knee straight while the tendon heals. Typically, the brace will be worn for about 6 weeks.  Crutches to keep weight off of your injured leg.  Physical therapy to improve strength and flexibility. If the injury involves a complete tear of the tendon, surgery is usually needed. Follow these instructions at home: RICE Therapy  Rest the injured leg.  If directed, put ice on the injured area:  Put ice in a plastic bag.  Place a towel between your skin and the bag.  Leave the ice on for 20 minutes, 2-3 times a day.  Apply a compression bandage to  the area as told by your health care provider.  Elevate the injured area above the level of your heart while you are sitting or lying down. If You Have a Knee Brace:  Wear the brace as told by your health care provider. Remove it only as told by your health care provider.  Loosen the brace if your toes tingle, become numb, or turn cold and blue.  Do not let your brace get wet if it is not waterproof.  Keep the brace clean. Activity  Return to your normal activities as told by your health care provider. Ask your health care provider what activities are safe for you.  Do not use the injured limb to support your body weight until your health care provider says that you can. Use crutches as told by your health care provider.  Do exercises as told by your health care provider. General instructions  Take over-the-counter and prescription medicines only as told by your health care provider.  Keep all follow-up visits as told by your health care provider. This is important. How is this prevented?  Give your body time to rest between periods of activity.  Be safe and responsible while being active to avoid falls. Contact a health care provider if:  Your pain and swelling continue or get worse, even with treatment and rest.  You are unable to walk or stand without your knee feeling like it will give way. Get help right away if:  You are unable to straighten your knee from a bent position. This information is not intended to replace advice given to you by your health care provider. Make sure you discuss any questions you have with your health care provider. Document Released: 10/30/2005 Document Revised: 07/04/2016 Document Reviewed: 10/12/2015 Elsevier Interactive Patient Education  2017 [employer]

## 2016-10-17 NOTE — Telephone Encounter (Signed)
Lft vm to reschedule appt °

## 2016-10-17 NOTE — Telephone Encounter (Signed)
Pt returned call to reschedule appt. Appt has been rescheduled to 11/28/16 w/Kayla Boggs. Pt agreed.

## 2016-10-17 NOTE — Progress Notes (Signed)
Subjective:  Patient ID: Carly FettersSandra J Harper, female    DOB: 10-Mar-1961  Age: 55 y.o. MRN: [id number]  CC: Leg Pain (right leg pain,muscle tight at night (going on for a year but notice the really bad pain since last week.pt. took advile. )   Leg Pain   Incident onset: ongoint for 1year, worse over last 67month. The injury mechanism is unknown. The pain is present in the right thigh (right lateral thigh). The quality of the pain is described as cramping, burning and aching. The pain is severe. The pain has been constant since onset. Pertinent negatives include no loss of motion, loss of sensation, muscle weakness, numbness or tingling. Associated symptoms comments: Difficulty sitting with legs crossed. The symptoms are aggravated by palpation (walking.). She has tried acetaminophen, rest, elevation and heat for the symptoms. The treatment provided no relief.    No outpatient prescriptions prior to visit.   No facility-administered medications prior to visit.     ROS See HPI  Objective:  BP 110/74   Pulse 74   Temp 98.1 F (36.7 C)   Ht 5\' 10"  (1.778 m)   Wt 165 lb (74.8 kg)   LMP 08/13/2012   SpO2 98%   BMI 23.68 kg/m   BP Readings from Last 3 Encounters:  10/17/16 110/74  08/30/16 116/70  03/14/16 134/86    Wt Readings from Last 3 Encounters:  10/17/16 165 lb (74.8 kg)  08/30/16 165 lb (74.8 kg)  03/14/16 161 lb (73 kg)   Physical Exam collaborated with Dr. Katrinka BlazingSmith. (sports Medicine).  Physical Exam  Constitutional: She is oriented to person, place, and time. No distress.  Cardiovascular: Normal rate.   Pulmonary/Chest: Effort normal.  Musculoskeletal: She exhibits tenderness. She exhibits no edema.       Right hip: Normal.       Right knee: She exhibits abnormal patellar mobility. She exhibits normal range of motion, no swelling, no effusion and no erythema. Tenderness found.       Right ankle: Normal.       Lumbar back: Normal.       Right upper leg: She exhibits  tenderness. She exhibits no bony tenderness, no swelling and no edema.       Right lower leg: Normal.       Right foot: Normal.  Low position of right patella compared to left when knee is flexed at 90 degrees. With patellar displaced laterally.  Neurological: She is alert and oriented to person, place, and time.  Skin: Skin is warm and dry.  Vitals reviewed.   Lab Results  Component Value Date   WBC 5.3 03/21/2016   HGB 14.9 03/21/2016   HCT 43.9 03/21/2016   PLT 281.0 03/21/2016   GLUCOSE 101 (H) 03/21/2016   CHOL 242 (H) 03/21/2016   TRIG 90.0 03/21/2016   HDL 58.20 03/21/2016   LDLCALC 166 (H) 03/21/2016   ALT 18 03/21/2016   AST 20 03/21/2016   NA 141 03/21/2016   K 4.8 03/21/2016   CL 105 03/21/2016   CREATININE 0.80 03/21/2016   BUN 18 03/21/2016   CO2 28 03/21/2016   TSH 2.34 03/21/2016   INR 0.9 01/20/2008    Mm Digital Screening Bilateral  Result Date: 09/13/2016 CLINICAL DATA:  Screening. EXAM: DIGITAL SCREENING BILATERAL MAMMOGRAM WITH CAD COMPARISON:  Previous exam(s). ACR Breast Density Category c: The breast tissue is heterogeneously dense, which may obscure small masses. FINDINGS: There are no findings suspicious for malignancy. Images were processed with  CAD. IMPRESSION: No mammographic evidence of malignancy. A result letter of this screening mammogram will be mailed directly to the patient. RECOMMENDATION: Screening mammogram in one year. (Code:SM-B-01Y) BI-RADS CATEGORY  1: Negative. Electronically Signed   By: Bary RichardStan  Maynard M.D.   On: 09/13/2016 14:23    Assessment & Plan:   Collaborated with Dr. Katrinka BlazingSmith (sports Medicine)  [name] was seen today for leg pain.  Diagnoses and all orders for this visit:  Quadriceps tendon rupture, right, initial encounter -     meloxicam (MOBIC) 7.5 MG tablet; Take 1 tablet (7.5 mg total) by mouth daily. -     Ambulatory referral to Sports Medicine  Musculoskeletal thigh pain, right   I am having [name] start on  meloxicam.  Meds ordered this encounter  Medications  . meloxicam (MOBIC) 7.5 MG tablet    Sig: Take 1 tablet (7.5 mg total) by mouth daily.    Dispense:  30 tablet    Refill:  0    Order Specific Question:   Supervising Provider    Answer:   Tresa GarterPLOTNIKOV, ALEKSEI V [[id number]]   Follow-up: Return if symptoms worsen or fail to improve, for follow up appt with Dr. Katrinka BlazingSmith in 2weeks.  Alysia Pennaharlotte Jayra Choyce, NP

## 2016-10-17 NOTE — Progress Notes (Signed)
Pre visit review using our clinic review tool, if applicable. No additional management support is needed unless otherwise documented below in the visit note. 

## 2016-10-19 ENCOUNTER — Encounter: Payer: Self-pay | Admitting: Internal Medicine

## 2016-10-19 ENCOUNTER — Encounter: Payer: BLUE CROSS/BLUE SHIELD | Admitting: Genetic Counselor

## 2016-10-19 ENCOUNTER — Other Ambulatory Visit: Payer: BLUE CROSS/BLUE SHIELD

## 2016-10-31 ENCOUNTER — Encounter: Payer: Self-pay | Admitting: Internal Medicine

## 2016-11-04 NOTE — Progress Notes (Signed)
[name] D.O. [facility] [address] [place], [place] [address] Phone: [phone] Subjective:    I'm seeing this patient by the request  of:  [name] NP  CC: Quadricep tendon, leg pain  BJY:NWGNFAOZHYHPI:Subjective  [name] is a 55 y.o. female coming in with complaint of right leg pain. Patient states that his been intermittent for approximately 1 year. Significant worsening over the course last 6 weeks. Does not remember any true injury. An states that most pain seems to be in the right lateral thigh. Pain does seem to be severe. Patient has had difficulty with sitting in the legs crossed and seems to be worse with any type or walking long distances.Patient did try a brace that has been helpful. Patient has decreased all the exercises that she was doing regularly. Patient was to start hiking again but does not think she will be able to do so on a regular basis. Denies any instability. Denies any radiation of pain sometimes can have some lateral hip pain. Denies any back pain.     Past Medical History:  Diagnosis Date  . Cervical dysplasia   . CRP elevated   . Hypercholesteremia    Past Surgical History:  Procedure Laterality Date  . CERVICAL BIOPSY  W/ LOOP ELECTRODE EXCISION  10/04   LGSIL  . CESAREAN SECTION    . OOPHORECTOMY     ? RSO, DERMOID   Social History   Social History  . Marital status: Married    Spouse name: N/A  . Number of children: N/A  . Years of education: N/A   Social History Main Topics  . Smoking status: Never Smoker  . Smokeless tobacco: Never Used  . Alcohol use 4.2 oz/week    7 Standard drinks or equivalent per week  . Drug use: No  . Sexual activity: Yes    Birth control/ protection:      Comment: 1st intercourse 55 yo-Fewer than 5 partners   Other Topics Concern  . None   Social History Narrative   Exercise - regularl   No Known Allergies Family History  Problem Relation Age of Onset  . Cancer Mother     ov/ut  .  Breast cancer Mother [id number]  . Hypertension Mother   . Heart disease Father   . Hypertension Father   . Diabetes Brother   . Kidney disease Brother   . Breast cancer Sister     Age 56's  . Cancer Sister     ov/ut    Past medical history, social, surgical and family history all reviewed in electronic medical record.  No pertanent information unless stated regarding to the chief complaint.   Review of Systems:Review of systems updated and as accurate as of 11/07/16  No headache, visual changes, nausea, vomiting, diarrhea, constipation, dizziness, abdominal pain, skin rash, fevers, chills, night sweats, weight loss, swollen lymph nodes, body aches, joint swelling, muscle aches, chest pain, shortness of breath, mood changes.   Objective  Blood pressure 120/80, pulse 63, height 5\' 10"  (1.778 m), weight 168 lb (76.2 kg), last menstrual period 08/13/2012, SpO2 97 %. Systems examined below as of 11/07/16   General: No apparent distress alert and oriented x3 mood and affect normal, dressed appropriately.  HEENT: Pupils equal, extraocular movements intact  Respiratory: Patient's speak in full sentences and does not appear short of breath  Cardiovascular: No lower extremity edema, non tender, no erythema  Skin: Warm dry intact with no signs of infection  or rash on extremities or on axial skeleton.  Abdomen: Soft nontender  Neuro: Cranial nerves II through XII are intact, neurovascularly intact in all extremities with 2+ DTRs and 2+ pulses.  Lymph: No lymphadenopathy of posterior or anterior cervical chain or axillae bilaterally.  Gait normal with good balance and coordination.  MSK:  Non tender with full range of motion and good stability and symmetric strength and tone of shoulders, elbows, wrist, hip and ankles bilaterally.  Knee:Right Normal to inspection with no erythema or effusion or obvious bony abnormalities. Tender over the lateral joint line ROM full in flexion and extension and lower  leg rotation. Ligaments with solid consistent endpoints including ACL, PCL, LCL, MCL. Negative Mcmurray's, Apley's, and Thessalonian tests. Mild painful patellar compression. Patellar glide mild crepitus. Patellar and quadriceps tendons unremarkable. Hamstring and quadriceps strength is normal.  Contralateral knee unremarkable  MSK US performed of: Right This study was ordered, performed, and interpreted by Terrilee FilesZach Dafney Farler D.O.  Knee: Patient is cortical changes of the distal lateral femoral condyle that appears to be possibly stress reaction. Increasing Doppler flow. Patient does have a popliteal tendinitis noted with hypoechoic changes within the fossa. Degenerative tearing of the lateral meniscus. Healing quadricep tendon tear noted.  IMPRESSION:  Question distal femoral stress fracture.    Impression and Recommendations:     This case required medical decision making of moderate complexity.      Note: This dictation was prepared with Dragon dictation along with smaller phrase technology. Any transcriptional errors that result from this process are unintentional.

## 2016-11-07 ENCOUNTER — Ambulatory Visit: Payer: Self-pay

## 2016-11-07 ENCOUNTER — Encounter: Payer: Self-pay | Admitting: Family Medicine

## 2016-11-07 ENCOUNTER — Ambulatory Visit (INDEPENDENT_AMBULATORY_CARE_PROVIDER_SITE_OTHER): Payer: BLUE CROSS/BLUE SHIELD | Admitting: Family Medicine

## 2016-11-07 ENCOUNTER — Other Ambulatory Visit: Payer: Self-pay | Admitting: Family Medicine

## 2016-11-07 ENCOUNTER — Ambulatory Visit (INDEPENDENT_AMBULATORY_CARE_PROVIDER_SITE_OTHER)
Admission: RE | Admit: 2016-11-07 | Discharge: 2016-11-07 | Disposition: A | Discharge status: Home / Self Care | Payer: BLUE CROSS/BLUE SHIELD | Source: Ambulatory Visit | Attending: Family Medicine | Admitting: Family Medicine

## 2016-11-07 VITALS — BP 120/80 | HR 63 | Ht 70.0 in | Wt 168.0 lb

## 2016-11-07 DIAGNOSIS — M25562 Pain in left knee: Secondary | ICD-10-CM

## 2016-11-07 DIAGNOSIS — M79604 Pain in right leg: Secondary | ICD-10-CM | POA: Diagnosis not present

## 2016-11-07 DIAGNOSIS — M25561 Pain in right knee: Secondary | ICD-10-CM

## 2016-11-07 MED ORDER — VITAMIN D (ERGOCALCIFEROL) 1.25 MG (50000 UNIT) PO CAPS: 50000.0000 [IU] | ORAL_CAPSULE | ORAL | 0 refills | Status: DC

## 2016-11-07 MED ORDER — DICLOFENAC SODIUM 2 % TD SOLN: 2.0000 "application " | Freq: Two times a day (BID) | TRANSDERMAL | 3 refills | Status: DC

## 2016-11-07 NOTE — Assessment & Plan Note (Signed)
Patient has what appears to be more of a stress reaction. Differential also includes a chondromalacia. Do not feel that advance imaging is warranted but I do feel that an x-ray secondary to this being greater than one year. Patient given prescription for topical anti-inflammatories and once weekly vitamin D. Encourage her to continue to wear brace. We discussed over-the-counter orthotics to help decrease the amount of pressure with her regular daily activities. Discussed which activities to avoid. Home exercises given. Follow-up again in 3-4 weeks

## 2016-11-07 NOTE — Patient Instructions (Signed)
Good to see you.  Happy New Year! Xray downstairs Ice 20 minutes 2 times daily. Usually after activity and before bed. pennsaid pinkie amount topically 2 times daily as needed.  Once weekly vitamin D for 12 weeks.  Avoid high impact exercises for next 3 weeks OK to bike with 30 degree bend,  [employer] orthotics "total support" online would be great  Wear good shoes at work.  See me again in 3-4 weeks to make sure you are doing well.

## 2016-11-24 ENCOUNTER — Telehealth: Payer: Self-pay | Admitting: Genetic Counselor

## 2016-11-24 NOTE — Telephone Encounter (Signed)
Lt vm to reschedule 01/16 genetic appt.

## 2016-11-24 NOTE — Telephone Encounter (Signed)
Patient states that she was called to have appointments rescheduled. Rescheduled to next available.

## 2016-11-27 ENCOUNTER — Telehealth: Payer: Self-pay | Admitting: Internal Medicine

## 2016-11-27 DIAGNOSIS — S76111A Strain of right quadriceps muscle, fascia and tendon, initial encounter: Secondary | ICD-10-CM

## 2016-11-27 MED ORDER — MELOXICAM 15 MG PO TABS: 15.0000 mg | ORAL_TABLET | Freq: Every day | ORAL | 0 refills | Status: DC

## 2016-11-27 NOTE — Telephone Encounter (Signed)
Patient is requesting meloxicam to be sent to [facility] on [address].

## 2016-11-27 NOTE — Telephone Encounter (Signed)
Refill done.  

## 2016-11-28 ENCOUNTER — Encounter: Payer: BLUE CROSS/BLUE SHIELD | Admitting: Genetic Counselor

## 2016-11-28 ENCOUNTER — Other Ambulatory Visit: Payer: BLUE CROSS/BLUE SHIELD

## 2016-12-04 NOTE — Progress Notes (Signed)
Tawana ScaleZach Aunna Snooks D.O. [facility] [address] [place], [place] [address] Phone: [phone] Subjective:    I'm seeing this patient by the request  of:  [name] NP  CC: Leg pain follow-up  BJY:[id number]:Subjective  Carly FettersSandra J Harper is a 56 y.o. female coming in with complaint of right leg pain. Patient was found to have a potential stress fracture of the lateral femoral condyle and the knee. Was put on once weekly vitamin D, topical anti-inflammatories and was to wear brace. Patient was to increase activity slowly. Patient states She is making improvement. Patient denies any numbness. Denies any radiation of pain. States that when she seems to go from a reaching over to a straight standing of she has a sharp pain that takes her seconds. States that this is the only pain that she is still having. Has avoided certain activities. Not doing any high-impact. Would like to start running again. Patient's main goal though is to be in better shape when she has her grandchildren in the next 3 months. No new symptoms.     Past Medical History:  Diagnosis Date  . Cervical dysplasia   . CRP elevated   . Hypercholesteremia    Past Surgical History:  Procedure Laterality Date  . CERVICAL BIOPSY  W/ LOOP ELECTRODE EXCISION  10/04   LGSIL  . CESAREAN SECTION    . OOPHORECTOMY     ? RSO, DERMOID   Social History   Social History  . Marital status: Married    Spouse name: N/A  . Number of children: N/A  . Years of education: N/A   Social History Main Topics  . Smoking status: Never Smoker  . Smokeless tobacco: Never Used  . Alcohol use 4.2 oz/week    7 Standard drinks or equivalent per week  . Drug use: No  . Sexual activity: Yes    Birth control/ protection:      Comment: 1st intercourse 56 yo-Fewer than 5 partners   Other Topics Concern  . None   Social History Narrative   Exercise - regularl   No Known Allergies Family History  Problem Relation Age of Onset  . Cancer Mother      ov/ut  . Breast cancer Mother [id number]  . Hypertension Mother   . Heart disease Father   . Hypertension Father   . Diabetes Brother   . Kidney disease Brother   . Breast cancer Sister     Age 57's  . Cancer Sister     ov/ut    Past medical history, social, surgical and family history all reviewed in electronic medical record.  No pertanent information unless stated regarding to the chief complaint.   Review of Systems: No headache, visual changes, nausea, vomiting, diarrhea, constipation, dizziness, abdominal pain, skin rash, fevers, chills, night sweats, weight loss, swollen lymph nodes, body aches, joint swelling, muscle aches, chest pain, shortness of breath, mood changes.    Objective  Blood pressure 110/80, pulse 70, height 5\' 10"  (1.778 m), weight 161 lb (73 kg), last menstrual period 08/13/2012, SpO2 96 %.   Systems examined below as of 12/05/16 General: NAD A&O x3 mood, affect normal  HEENT: Pupils equal, extraocular movements intact no nystagmus Respiratory: not short of breath at rest or with speaking Cardiovascular: No lower extremity edema, non tender Skin: Warm dry intact with no signs of infection or rash on extremities or on axial skeleton. Abdomen: Soft nontender, no masses Neuro: Cranial nerves  intact, neurovascularly intact in  all extremities with 2+ DTRs and 2+ pulses. Lymph: No lymphadenopathy appreciated today  Gait normal with good balance and coordination.  MSK: Non tender with full range of motion and good stability and symmetric strength and tone of shoulders, elbows, wrist,  hips and ankles bilaterally.   Knee: Right Normal to inspection with no erythema or effusion or obvious bony abnormalities. Palpation normal with no warmth, joint line tenderness, patellar tenderness, or condyle tenderness. ROM full in flexion and extension and lower leg rotation. Ligaments with solid consistent endpoints including ACL, PCL, LCL, MCL. Negative Mcmurray's, Apley's,  and Thessalonian tests. Very mild painful patellar compression. Patellar glide with mild crepitus. Patellar and quadriceps tendons unremarkable. Hamstring and quadriceps strength is normal.  Contralateral knee unremarkable  MSK US performed of: Right This study was ordered, performed, and interpreted by Terrilee Files D.O.  Knee: Limited ultrasound did show that patient area where there was potential stress fracture shows significant callus formation. This is just inferior and medial to the patellar tendon. This seems to be in the area where patient is having the increasing pain with extension. Otherwise fairly unremarkable.  IMPRESSION:  Healing from oral stress fracture.    Impression and Recommendations:     This case required medical decision making of moderate complexity.      Note: This dictation was prepared with Dragon dictation along with smaller phrase technology. Any transcriptional errors that result from this process are unintentional.

## 2016-12-05 ENCOUNTER — Ambulatory Visit (INDEPENDENT_AMBULATORY_CARE_PROVIDER_SITE_OTHER): Payer: BLUE CROSS/BLUE SHIELD | Admitting: Family Medicine

## 2016-12-05 ENCOUNTER — Encounter: Payer: Self-pay | Admitting: Family Medicine

## 2016-12-05 DIAGNOSIS — M25561 Pain in right knee: Secondary | ICD-10-CM

## 2016-12-05 MED ORDER — VITAMIN D (ERGOCALCIFEROL) 1.25 MG (50000 UNIT) PO CAPS: 50000.0000 [IU] | ORAL_CAPSULE | ORAL | 0 refills | Status: DC

## 2016-12-05 NOTE — Assessment & Plan Note (Signed)
Patient seems to be improving. Encourage her to continue the vitamin D supplementation. We discussed icing regimen. We discussed objective is a doing which ones to avoid. Patient will come back and see me again in 4 weeks and we'll start her on more aggressive therapies.

## 2016-12-05 NOTE — Patient Instructions (Signed)
Good to see you  Try to not to wear as much  Look into a patella strap as well and see if you like that better Continue the vitamin D OK to lift but dont do deep squats or lock out the leg.  ICe is still your friend OK to row in 2 weeks.  See me again in 4 weeks and will get you back to running.

## 2017-01-02 ENCOUNTER — Ambulatory Visit: Payer: BLUE CROSS/BLUE SHIELD | Admitting: Family Medicine

## 2017-01-09 ENCOUNTER — Ambulatory Visit: Payer: BLUE CROSS/BLUE SHIELD | Admitting: Family Medicine

## 2017-01-16 ENCOUNTER — Ambulatory Visit (INDEPENDENT_AMBULATORY_CARE_PROVIDER_SITE_OTHER): Payer: BLUE CROSS/BLUE SHIELD | Admitting: Family Medicine

## 2017-01-16 ENCOUNTER — Encounter: Payer: Self-pay | Admitting: Family Medicine

## 2017-01-16 DIAGNOSIS — M25561 Pain in right knee: Secondary | ICD-10-CM

## 2017-01-16 DIAGNOSIS — S76111A Strain of right quadriceps muscle, fascia and tendon, initial encounter: Secondary | ICD-10-CM | POA: Diagnosis not present

## 2017-01-16 MED ORDER — MELOXICAM 15 MG PO TABS: 15.0000 mg | ORAL_TABLET | Freq: Every day | ORAL | 0 refills | Status: DC

## 2017-01-16 NOTE — Progress Notes (Signed)
[name] D.O. [facility] [address] [place], [place] [address] Phone: [phone] Subjective:    CC: right leg pain follow-up  ZHY:[id number]:Subjective  [name] is a 56 y.o. female coming in with complaint of right leg pain. Patient was found to have a potential stress fracture of the lateral femoral condyle and the knee. Was put on once weekly vitamin D, topical anti-inflammatories and was to wear brace. Patient was to increase activity slowly. Patient states that she was making improvement at last follow-up. This is 6 weeks ago. Patient states She continues to improve. States that she is 70-80% better. Still has some mild discomfort when she does a lot of activity. No true swelling. Patient states that she does feel that daily activities is no longer painful.     Past Medical History:  Diagnosis Date  . Cervical dysplasia   . CRP elevated   . Hypercholesteremia    Past Surgical History:  Procedure Laterality Date  . CERVICAL BIOPSY  W/ LOOP ELECTRODE EXCISION  10/04   LGSIL  . CESAREAN SECTION    . OOPHORECTOMY     ? RSO, DERMOID   Social History   Social History  . Marital status: Married    Spouse name: N/A  . Number of children: N/A  . Years of education: N/A   Social History Main Topics  . Smoking status: Never Smoker  . Smokeless tobacco: Never Used  . Alcohol use 4.2 oz/week    7 Standard drinks or equivalent per week  . Drug use: No  . Sexual activity: Yes    Birth control/ protection:      Comment: 1st intercourse 56 yo-Fewer than 5 partners   Other Topics Concern  . Not on file   Social History Narrative   Exercise - regularl   No Known Allergies Family History  Problem Relation Age of Onset  . Cancer Mother     ov/ut  . Breast cancer Mother [id number]  . Hypertension Mother   . Heart disease Father   . Hypertension Father   . Diabetes Brother   . Kidney disease Brother   . Breast cancer Sister     Age 57's  . Cancer Sister     ov/ut     Past medical history, social, surgical and family history all reviewed in electronic medical record.  No pertanent information unless stated regarding to the chief complaint.   Review of Systems: No headache, visual changes, nausea, vomiting, diarrhea, constipation, dizziness, abdominal pain, skin rash, fevers, chills, night sweats, weight loss, swollen lymph nodes, body aches, joint swelling, muscle aches, chest pain, shortness of breath, mood changes.    Objective  Last menstrual period 08/13/2012.   Systems examined below as of 01/16/17 General: NAD A&O x3 mood, affect normal  HEENT: Pupils equal, extraocular movements intact no nystagmus Respiratory: not short of breath at rest or with speaking Cardiovascular: No lower extremity edema, non tender Skin: Warm dry intact with no signs of infection or rash on extremities or on axial skeleton. Abdomen: Soft nontender, no masses Neuro: Cranial nerves  intact, neurovascularly intact in all extremities with 2+ DTRs and 2+ pulses. Lymph: No lymphadenopathy appreciated today  Gait normal with good balance and coordination.  MSK: Non tender with full range of motion and good stability and symmetric strength and tone of shoulders, elbows, wrist,  hips and ankles bilaterally.   Knee: Right Normal to inspection with no erythema or effusion or obvious bony abnormalities.  Very minimal pain just lateral to the patellar tendon at its insertion on the tibia. ROM full in flexion and extension and lower leg rotation. Ligaments with solid consistent endpoints including ACL, PCL, LCL, MCL. Negative Mcmurray's, Apley's, and Thessalonian tests. Patellar glide with mild crepitus. Patellar and quadriceps tendons unremarkable. Hamstring and quadriceps strength is normal.  Contralateral knee unremarkable .    Impression and Recommendations:     This case required medical decision making of moderate complexity.      Note: This dictation was  prepared with Dragon dictation along with smaller phrase technology. Any transcriptional errors that result from this process are unintentional.

## 2017-01-16 NOTE — Assessment & Plan Note (Signed)
Patient is still not normal with better. Patient has done formal physical therapy, home exercises, topical anti-inflammatories and once weekly vitamin D. We discussed with patient that at this point advance imaging could be warranted. Patient has elected try more conservative therapy for another month. We also discussed the potential for an injection. Patient will consider this at follow-up. Knows if worsening symptoms to come back sooner. Refill of meloxicam given today. Discussed differential being a potential OCD in the area which has been seen on ultrasound potential small fracture of the lateral aspect of the tibia. Patient once again though wants to continue with conservative therapy and we will see her again in 4 weeks.  Spent  25 minutes with patient face-to-face and had greater than 50% of counseling including as described above in assessment and plan.

## 2017-01-16 NOTE — Patient Instructions (Signed)
God to see you  K2 daily can help with the vitamin D and bone healing OK to increase activity but let the discomfort guide you  Ice and stretch afterward.  Strap you can wear if it helps.  Bike 3 times a week and weights 3 times a week.  See me again in 4 weeks just to make sure you are near all the way there.

## 2017-02-05 ENCOUNTER — Encounter: Payer: BLUE CROSS/BLUE SHIELD | Admitting: Genetic Counselor

## 2017-02-05 ENCOUNTER — Other Ambulatory Visit: Payer: BLUE CROSS/BLUE SHIELD

## 2017-02-13 ENCOUNTER — Ambulatory Visit: Payer: BLUE CROSS/BLUE SHIELD | Admitting: Family Medicine

## 2017-02-23 ENCOUNTER — Other Ambulatory Visit: Payer: Self-pay | Admitting: Family Medicine

## 2017-02-26 NOTE — Progress Notes (Deleted)
[name] [address] [place], [place] [address] Phone: [phone] Subjective:    CC: right leg pain follow-up  [id number]:[id number]  [name] is a 56 y.o. female coming in with complaint of right leg pain. Patient was found to have a potential stress fracture of the lateral femoral condyle and the knee. Was put on once weekly vitamin D, topical anti-inflammatories and was to wear brace. Patient was to increase activity slowly. Patient states that she was making improvement at last follow-up. This is 6 weeks ago. Patient states She continues to improve. States that she is 70-80% better. Still has some mild discomfort when she does a lot of activity. No true swelling. Patient states that she does feel that daily activities is no longer painful.     Past Medical History:  Diagnosis Date  . Cervical dysplasia   . CRP elevated   . Hypercholesteremia    Past Surgical History:  Procedure Laterality Date  . CERVICAL BIOPSY  W/ LOOP ELECTRODE EXCISION  10/04   LGSIL  . CESAREAN SECTION    . OOPHORECTOMY     ? RSO, DERMOID   Social History   Social History  . Marital status: Married    Spouse name: N/A  . Number of children: N/A  . Years of education: N/A   Social History Main Topics  . Smoking status: Never Smoker  . Smokeless tobacco: Never Used  . Alcohol use 4.2 oz/week    7 Standard drinks or equivalent per week  . Drug use: No  . Sexual activity: Yes    Birth control/ protection:      Comment: 1st intercourse 56 yo-Fewer than 5 partners   Other Topics Concern  . Not on file   Social History Narrative   Exercise - regularl   No Known Allergies Family History  Problem Relation Age of Onset  . Cancer Mother     ov/ut  . Breast cancer Mother 73  . Hypertension Mother   . Heart disease Father   . Hypertension Father   . Diabetes Brother   . Kidney disease Brother   . Breast cancer Sister     Age 74's  . Cancer Sister     ov/ut     Past medical history, social, surgical and family history all reviewed in electronic medical record.  No pertanent information unless stated regarding to the chief complaint.   Review of Systems: No headache, visual changes, nausea, vomiting, diarrhea, constipation, dizziness, abdominal pain, skin rash, fevers, chills, night sweats, weight loss, swollen lymph nodes, body aches, joint swelling, muscle aches, chest pain, shortness of breath, mood changes.    Objective  Last menstrual period 08/13/2012.   Systems examined below as of 02/26/17 General: NAD A&O x3 mood, affect normal  HEENT: Pupils equal, extraocular movements intact no nystagmus Respiratory: not short of breath at rest or with speaking Cardiovascular: No lower extremity edema, non tender Skin: Warm dry intact with no signs of infection or rash on extremities or on axial skeleton. Abdomen: Soft nontender, no masses Neuro: Cranial nerves  intact, neurovascularly intact in all extremities with 2+ DTRs and 2+ pulses. Lymph: No lymphadenopathy appreciated today  Gait normal with good balance and coordination.  MSK: Non tender with full range of motion and good stability and symmetric strength and tone of shoulders, elbows, wrist,  hips and ankles bilaterally.   Knee: Right Normal to inspection with no erythema or effusion or obvious bony abnormalities.  Very minimal pain just lateral to the patellar tendon at its insertion on the tibia. ROM full in flexion and extension and lower leg rotation. Ligaments with solid consistent endpoints including ACL, PCL, LCL, MCL. Negative Mcmurray's, Apley's, and Thessalonian tests. Patellar glide with mild crepitus. Patellar and quadriceps tendons unremarkable. Hamstring and quadriceps strength is normal.  Contralateral knee unremarkable .    Impression and Recommendations:     This case required medical decision making of moderate complexity.      Note: This dictation was  prepared with Dragon dictation along with smaller phrase technology. Any transcriptional errors that result from this process are unintentional.

## 2017-02-27 ENCOUNTER — Ambulatory Visit: Payer: BLUE CROSS/BLUE SHIELD | Admitting: Family Medicine

## 2017-03-06 ENCOUNTER — Telehealth: Payer: Self-pay | Admitting: Internal Medicine

## 2017-03-06 NOTE — Telephone Encounter (Signed)
Patient spouse calling in for wife.  States that patient had a cologuard done a year ago and is requesting results.

## 2017-03-07 NOTE — Telephone Encounter (Signed)
LVM with pts spouse to inform cologuard was negative.

## 2017-03-08 ENCOUNTER — Telehealth: Payer: Self-pay | Admitting: Internal Medicine

## 2017-03-08 NOTE — Telephone Encounter (Signed)
LVM with pts husband with results on 03/07/17

## 2017-03-08 NOTE — Telephone Encounter (Signed)
Your cologuard test is negative.  This should be repeated in 3 years.

## 2017-03-14 ENCOUNTER — Encounter: Payer: BLUE CROSS/BLUE SHIELD | Admitting: Internal Medicine

## 2017-05-06 NOTE — Progress Notes (Signed)
[name] [address] [place], [place] [address] Phone: [phone] Subjective:    I'm seeing this patient by the request  of:    CC: Right knee pain follow-up  BJY:[id number]  [name] is a 56 y.o. female coming in with complaint of right knee pain follow-up. Patient was seen previously and had what could benefit potentially OCD of the knee. Patient was given a referral for physical therapy. But on once weekly vitamin D. Patient declined any type of injection or possible advance imaging at that time. Been 3 months since we seen patient. Patient states Knee was feeling somewhat better. Now having pain that seems to be on the lateral posterior aspect of the hip but can radiate down the leg intermittently. Feels like she moves has weakness or instability. Does not rib or any true injury. Denies any groin pain. States that it can catch her and normal seals like she is going to fall.   previous x-rays were independently visualized by me showing no bony normality of the right knee  Past Medical History:  Diagnosis Date  . Cervical dysplasia   . CRP elevated   . Hypercholesteremia    Past Surgical History:  Procedure Laterality Date  . CERVICAL BIOPSY  W/ LOOP ELECTRODE EXCISION  10/04   LGSIL  . CESAREAN SECTION    . OOPHORECTOMY     ? RSO, DERMOID   Social History   Social History  . Marital status: Married    Spouse name: N/A  . Number of children: N/A  . Years of education: N/A   Social History Main Topics  . Smoking status: Never Smoker  . Smokeless tobacco: Never Used  . Alcohol use 4.2 oz/week    7 Standard drinks or equivalent per week  . Drug use: No  . Sexual activity: Yes    Birth control/ protection:      Comment: 1st intercourse 56 yo-Fewer than 5 partners   Other Topics Concern  . None   Social History Narrative   Exercise - regularl   No Known Allergies Family History  Problem Relation Age of Onset  . Cancer  Mother        ov/ut  . Breast cancer Mother 63  . Hypertension Mother   . Heart disease Father   . Hypertension Father   . Diabetes Brother   . Kidney disease Brother   . Breast cancer Sister        Age 84's  . Cancer Sister        ov/ut    Past medical history, social, surgical and family history all reviewed in electronic medical record.  No pertanent information unless stated regarding to the chief complaint.   Review of Systems:Review of systems updated and as accurate as of 05/07/17  No headache, visual changes, nausea, vomiting, diarrhea, constipation, dizziness, abdominal pain, skin rash, fevers, chills, night sweats, weight loss, swollen lymph nodes, body aches, joint swelling, muscle aches, chest pain, shortness of breath, mood changes.   Objective  Blood pressure 130/84, pulse 74, height 5\' 10"  (1.778 m), weight 171 lb (77.6 kg), last menstrual period 08/13/2012, SpO2 97 %. Systems examined below as of 05/07/17   General: No apparent distress alert and oriented x3 mood and affect normal, dressed appropriately.  HEENT: Pupils equal, extraocular movements intact  Respiratory: Patient's speak in full sentences and does not appear short of breath  Cardiovascular: No lower extremity edema, non tender, no erythema  Skin: Warm dry intact with no signs of infection or rash on extremities or on axial skeleton.  Abdomen: Soft nontender  Neuro: Cranial nerves II through XII are intact, neurovascularly intact in all extremities with 2+ DTRs and 2+ pulses.  Lymph: No lymphadenopathy of posterior or anterior cervical chain or axillae bilaterally.  Gait Mild antalgic gait MSK:  Non tender with full range of motion and good stability and symmetric strength and tone of shoulders, elbows, wrist, , and ankles bilaterally.  Knee:Right Normal to inspection with no erythema or effusion or obvious bony abnormalities. Palpation normal with no warmth, joint line tenderness, patellar tenderness,  or condyle tenderness. ROM full in flexion and extension and lower leg rotation. Ligaments with solid consistent endpoints including ACL, PCL, LCL, MCL. Negative Mcmurray's, Apley's, and Thessalonian tests. Non painful patellar compression. Patellar glide without crepitus. Patellar and quadriceps tendons unremarkable. Hamstring and quadriceps strength is normal.   Right hip exam shows the patient does have some tenderness on posterior and lateral aspects of the hip. Mild decrease in internal range of motion. Significant tightness with external range of motion. The intact distally. Negative straight leg test.   MSK US performed of: Right hip This study was ordered, performed, and interpreted by Terrilee FilesZach Aune Adami D.O.  Hip: Tender to palpation over the trochanteric area. Patient does have some nonspecific hypoechoic changes. Possible posterior fusion of the hip noted with significant narrowing.  IMPRESSION:  Questionable hip arthritis.      Impression and Recommendations:     This case required medical decision making of moderate complexity.      Note: This dictation was prepared with Dragon dictation along with smaller phrase technology. Any transcriptional errors that result from this process are unintentional.

## 2017-05-07 ENCOUNTER — Ambulatory Visit (INDEPENDENT_AMBULATORY_CARE_PROVIDER_SITE_OTHER)
Admission: RE | Admit: 2017-05-07 | Discharge: 2017-05-07 | Disposition: A | Discharge status: Home / Self Care | Payer: BLUE CROSS/BLUE SHIELD | Source: Ambulatory Visit | Attending: Family Medicine | Admitting: Family Medicine

## 2017-05-07 ENCOUNTER — Ambulatory Visit (INDEPENDENT_AMBULATORY_CARE_PROVIDER_SITE_OTHER): Payer: BLUE CROSS/BLUE SHIELD | Admitting: Family Medicine

## 2017-05-07 ENCOUNTER — Ambulatory Visit: Payer: Self-pay

## 2017-05-07 ENCOUNTER — Encounter: Payer: Self-pay | Admitting: Family Medicine

## 2017-05-07 VITALS — BP 130/84 | HR 74 | Ht 70.0 in | Wt 171.0 lb

## 2017-05-07 DIAGNOSIS — M79604 Pain in right leg: Secondary | ICD-10-CM

## 2017-05-07 DIAGNOSIS — M25551 Pain in right hip: Secondary | ICD-10-CM

## 2017-05-07 MED ORDER — GABAPENTIN 100 MG PO CAPS: 200.0000 mg | ORAL_CAPSULE | Freq: Every day | ORAL | 3 refills | Status: DC

## 2017-05-07 MED ORDER — PREDNISONE 50 MG PO TABS: 50.0000 mg | ORAL_TABLET | Freq: Every day | ORAL | 0 refills | Status: DC

## 2017-05-07 NOTE — Patient Instructions (Signed)
Good to see you  Gustavus Bryantce is your friend.  Prednisone daily for 5 days Gabapentin 25435m g at night Take it easy  Go get xrays downstairs on way out.  See me again in 10- 14 days (ok to double book)

## 2017-05-07 NOTE — Assessment & Plan Note (Signed)
Difficult to assess. Patient is having pain. Can be lumbar radiculopathy, gluteal tendinitis, piriformis syndrome, possibly with patient's ultrasound there is a possibility for arthritis. Patient will have x-rays done. Depending on findings this could change medical management. We discussed topical anti-inflammatories, gabapentin at night. Patient will try these interventions and come back and see me again in 3-4 weeks.

## 2017-05-09 ENCOUNTER — Encounter: Payer: Self-pay | Admitting: Family Medicine

## 2017-05-22 ENCOUNTER — Ambulatory Visit (INDEPENDENT_AMBULATORY_CARE_PROVIDER_SITE_OTHER): Payer: BLUE CROSS/BLUE SHIELD | Admitting: Family Medicine

## 2017-05-22 ENCOUNTER — Encounter: Payer: Self-pay | Admitting: Family Medicine

## 2017-05-22 DIAGNOSIS — M1611 Unilateral primary osteoarthritis, right hip: Secondary | ICD-10-CM | POA: Diagnosis not present

## 2017-05-22 MED ORDER — GABAPENTIN 300 MG PO CAPS: ORAL_CAPSULE | ORAL | 3 refills | Status: DC

## 2017-05-22 MED ORDER — VITAMIN D (ERGOCALCIFEROL) 1.25 MG (50000 UNIT) PO CAPS: 50000.0000 [IU] | ORAL_CAPSULE | ORAL | 0 refills | Status: DC

## 2017-05-22 NOTE — Assessment & Plan Note (Signed)
Discussed with patient at great length. Spent  25 minutes with patient face-to-face and had greater than 50% of counseling including as described in assessment and plan. Discuss different treatment options with her as well as her husband. We discussed the possibility of intra-articular injections, referral for surgery, possible MRI for further evaluation. Patient has elected try conservative therapy. Has had some benefit with patient doing the gabapentin. Encourage patient to do the once weekly vitamin D. Follow-up again in 6 weeks.

## 2017-05-22 NOTE — Patient Instructions (Signed)
Good to see you  Gabapentin 300mg  at night Once weekly vitamin D for 12 weeks.  See me if you want to try injection.

## 2017-05-22 NOTE — Progress Notes (Signed)
[name] D.O. [facility] [address] [place], [place] [address] Phone: [phone] Subjective:    I'm seeing this patient by the request  of:    CC: Right knee pain follow-up  NFA:[id number]:Subjective  Carly FettersSandra J Harper is a 56 y.o. female coming in with complaint of right knee pain follow-up. Patient was seen previously and had what could benefit potentially OCD of the knee. Patient was given a referral for physical therapy. But on once weekly vitamin D. States that knee seems to be doing relatively well. Was having signs and symptoms that could've been consistent with a arthritis of the hip. Patient was sent for hip x-rays. 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 Patient also had lumbar x-rays showing arthritic changes as well. L4-L5 and L5-S1. Patient states that it is affecting some of her working out. States that even daily activities if she moves around way has a severe pain mostly in the groin.     Past Medical History:  Diagnosis Date  . Cervical dysplasia   . CRP elevated   . Hypercholesteremia    Past Surgical History:  Procedure Laterality Date  . CERVICAL BIOPSY  W/ LOOP ELECTRODE EXCISION  10/04   LGSIL  . CESAREAN SECTION    . OOPHORECTOMY     ? RSO, DERMOID   Social History   Social History  . Marital status: Married    Spouse name: N/A  . Number of children: N/A  . Years of education: N/A   Social History Main Topics  . Smoking status: Never Smoker  . Smokeless tobacco: Never Used  . Alcohol use 4.2 oz/week    7 Standard drinks or equivalent per week  . Drug use: No  . Sexual activity: Yes    Birth control/ protection:      Comment: 1st intercourse 56 yo-Fewer than 5 partners   Other Topics Concern  . None   Social History Narrative   Exercise - regularl   No Known Allergies Family History  Problem Relation Age of Onset  . Cancer Mother        ov/ut  . Breast cancer Mother [id number]  .  Hypertension Mother   . Heart disease Father   . Hypertension Father   . Diabetes Brother   . Kidney disease Brother   . Breast cancer Sister        Age 430's  . Cancer Sister        ov/ut    Past medical history, social, surgical and family history all reviewed in electronic medical record.  No pertanent information unless stated regarding to the chief complaint.   Review of Systems: No headache, visual changes, nausea, vomiting, diarrhea, constipation, dizziness, abdominal pain, skin rash, fevers, chills, night sweats, weight loss, swollen lymph nodes, body aches, joint swelling,  chest pain, shortness of breath, mood changes.  Positive muscle aches  Objective  Blood pressure 110/80, pulse 78, height 5\' 10"  (1.778 m), weight 170 lb (77.1 kg), last menstrual period 08/13/2012, SpO2 97 %.   Systems examined below as of 05/22/17 General: NAD A&O x3 mood, affect normal  HEENT: Pupils equal, extraocular movements intact no nystagmus Respiratory: not short of breath at rest or with speaking Cardiovascular: No lower extremity edema, non tender Skin: Warm dry intact with no signs of infection or rash on extremities or on axial skeleton. Abdomen: Soft nontender, no masses Neuro: Cranial nerves  intact, neurovascularly intact in all extremities with  2+ DTRs and 2+ pulses. Lymph: No lymphadenopathy appreciated today  Gait normal with good balance and coordination.  MSK: Non tender with full range of motion and good stability and symmetric strength and tone of shoulders, elbows, wrist,  knee and ankles bilaterally.    Hip: Right ROM IR: 15 Deg with worsening pain, ER: 30 Deg, Flexion: 100 Deg, Extension: 100 Deg, Abduction: 45 Deg, Adduction: 25 Deg Strength IR: 5/5, ER: 5/5, Flexion: 5/5, Extension: 5/5, Abduction: 5/5, Adduction: 5/5 Pelvic alignment unremarkable to inspection and palpation. Standing hip rotation and gait without trendelenburg sign / unsteadiness. Greater trochanter  without tenderness to palpation. No tenderness over piriformis and greater trochanter. Severe pain with internal rotation No SI joint tenderness and normal minimal SI movement. Patient does have some pain in the paraspinal musculature lumbar spine mostly over the L4-S1 area.       Impression and Recommendations:     This case required medical decision making of moderate complexity.      Note: This dictation was prepared with Dragon dictation along with smaller phrase technology. Any transcriptional errors that result from this process are unintentional.

## 2017-06-29 ENCOUNTER — Telehealth: Payer: Self-pay | Admitting: Internal Medicine

## 2017-06-29 NOTE — Telephone Encounter (Signed)
Patient requesting call back in regard to Cortizone injection.  States Dr. Katrinka Blazing informed her that she would need to have a hop replacement.  Patient states its not a good time right not to get.  Patient is in pain and would like Cortizone injection as soon as possible.  Patient was also interested with insurance coverage on the injection.  I told patient you could possible give her the code to contact her insurance to find out coverage.

## 2017-07-02 NOTE — Telephone Encounter (Signed)
lmovm for pt to return call.  

## 2017-07-02 NOTE — Telephone Encounter (Signed)
Spoke to pt, information given, & scheduled pt for injection.

## 2017-07-02 NOTE — Telephone Encounter (Signed)
Really well covered most of the time.  CPT 20610  M16.11 for hip arthritis.

## 2017-07-10 ENCOUNTER — Ambulatory Visit (INDEPENDENT_AMBULATORY_CARE_PROVIDER_SITE_OTHER): Payer: BLUE CROSS/BLUE SHIELD | Admitting: Family Medicine

## 2017-07-10 ENCOUNTER — Ambulatory Visit: Payer: Self-pay

## 2017-07-10 ENCOUNTER — Encounter: Payer: Self-pay | Admitting: Family Medicine

## 2017-07-10 VITALS — BP 102/80 | HR 71 | Ht 70.0 in | Wt 167.0 lb

## 2017-07-10 DIAGNOSIS — M1611 Unilateral primary osteoarthritis, right hip: Secondary | ICD-10-CM

## 2017-07-10 DIAGNOSIS — S76111A Strain of right quadriceps muscle, fascia and tendon, initial encounter: Secondary | ICD-10-CM | POA: Diagnosis not present

## 2017-07-10 MED ORDER — MELOXICAM 15 MG PO TABS: 15.0000 mg | ORAL_TABLET | Freq: Every day | ORAL | 0 refills | Status: DC

## 2017-07-10 NOTE — Assessment & Plan Note (Signed)
Patient given injection today. Will be referred to orthopedic surgery to discuss the possible for a replacement in the next months. Hopefully this will give her some relief for some time. Encourage her to continue the vitamin D supplementation, gabapentin, icing regimen. Patient will follow-up with me again in 4-8 weeks.

## 2017-07-10 NOTE — Progress Notes (Signed)
[name] [address] [place], [place] [address] Phone: [phone] Subjective:     CC: Right hip arthritis follow up   [id number]  [name] is a 56 y.o. female coming in with complaint of right leg pain. Was seen previously and did have what appeared to be severe arthritis of the right hip. Patient has attempted conservative therapy with home exercises, icing regimen, and avoiding certain activities. Patient is taking over-the-counter medications. Patient states Worsening symptoms with direct groin pain. Patient states since is affecting daily activities. Patient would like to continue to be active and is not wanting knee replacement at this time. We will be a grandmother in March though it would think about getting this replaced before then.      Past Medical History:  Diagnosis Date  . Cervical dysplasia   . CRP elevated   . Hypercholesteremia    Past Surgical History:  Procedure Laterality Date  . CERVICAL BIOPSY  W/ LOOP ELECTRODE EXCISION  10/04   LGSIL  . CESAREAN SECTION    . OOPHORECTOMY     ? RSO, DERMOID   Social History   Social History  . Marital status: Married    Spouse name: N/A  . Number of children: N/A  . Years of education: N/A   Social History Main Topics  . Smoking status: Never Smoker  . Smokeless tobacco: Never Used  . Alcohol use 4.2 oz/week    7 Standard drinks or equivalent per week  . Drug use: No  . Sexual activity: Yes    Birth control/ protection:      Comment: 1st intercourse 56 yo-Fewer than 5 partners   Other Topics Concern  . Not on file   Social History Narrative   Exercise - regularl   No Known Allergies Family History  Problem Relation Age of Onset  . Cancer Mother        ov/ut  . Breast cancer Mother 71  . Hypertension Mother   . Heart disease Father   . Hypertension Father   . Diabetes Brother   . Kidney disease Brother   . Breast cancer Sister        Age 84's  . Cancer  Sister        ov/ut     Past medical history, social, surgical and family history all reviewed in electronic medical record.  No pertanent information unless stated regarding to the chief complaint.   Review of Systems: No headache, visual changes, nausea, vomiting, diarrhea, constipation, dizziness, abdominal pain, skin rash, fevers, chills, night sweats, weight loss, swollen lymph nodes, body aches, joint swelling,, chest pain, shortness of breath, mood changes.  positive muscle aches  Objective  Last menstrual period 08/13/2012. Systems examined below as of 07/10/17   General: No apparent distress alert and oriented x3 mood and affect normal, dressed appropriately.  HEENT: Pupils equal, extraocular movements intact  Respiratory: Patient's speak in full sentences and does not appear short of breath  Cardiovascular: No lower extremity edema, non tender, no erythema  Skin: Warm dry intact with no signs of infection or rash on extremities or on axial skeleton.  Abdomen: Soft nontender  Neuro: Cranial nerves II through XII are intact, neurovascularly intact in all extremities with 2+ DTRs and 2+ pulses.  Lymph: No lymphadenopathy of posterior or anterior cervical chain or axillae bilaterally.  Gait normal with good balance and coordination.  MSK:  Non tender with full range of motion and good  stability and symmetric strength and tone of shoulders, elbows, wrist, , knee and ankles bilaterally.  hip exam shows the patient does have 5 of internal rotation with worsening pain.Negative straight leg test. Mild pain over the piriformis. Neg test. Full strength of the laravity.  Procedure: Real-time Ultrasound Guided Injection of right hip Device: GE Logiq Q7  Ultrasound guided injection is preferred based studies that show increased duration, increased effect, greater accuracy, decreased procedural pain, increased response rate with ultrasound guided versus blind injection.  Verbal informed  consent obtained.  Time-out conducted.  Noted no overlying erythema, induration, or other signs of local infection.  Skin prepped in a sterile fashion.  Local anesthesia: Topical Ethyl chloride.  With sterile technique and under real time ultrasound guidance:  Anterior capsule visualized, needle visualized going to the head neck junction at the anterior capsule. Pictures taken. Patient did have injection of  3 cc of 0.5% Marcaine, and 1 cc of Kenalog 40 mg/dL. Completed without difficulty  Pain immediately resolved suggesting accurate placement of the medication.  Advised to call if fevers/chills, erythema, induration, drainage, or persistent bleeding.  Images permanently stored and available for review in the ultrasound unit.  Impression: Technically successful ultrasound guided injection.    Impression and Recommendations:     This case required medical decision making of moderate complexity.      Note: This dictation was prepared with Dragon dictation along with smaller phrase technology. Any transcriptional errors that result from this process are unintentional.

## 2017-07-10 NOTE — Patient Instructions (Signed)
Good to see you  This should help [place] know for sure it is the hip  If it helps we can repeat every 3 months and I hope it last longer Take today easy and then no restrictions.  Ice 20 minutes 2 times daily. Usually after activity and before bed. Stay active otherwise  See me again in 4 weeks.

## 2017-07-24 NOTE — Progress Notes (Signed)
Subjective:    Patient ID: Carly FettersSandra J Baker, female    DOB: May 24, 1961, 56 y.o.   MRN: [id number]  HPI She is here for a physical exam.   She is having increased right hip pain and is following with Dr Katrinka BlazingSmith. She is limited in what she can do with exercise.  She is frustrated by her inability to exercise.   She is having muscle spasms at night.   She knows she will need surgery, but plans on trying to wait until July 2019.  Medications and allergies reviewed with patient and updated if appropriate.  Patient Active Problem List   Diagnosis Date Noted  . 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 Right hip pain 05/07/2017  . Right knee pain 11/07/2016  . Hypercholesteremia     Current Outpatient Prescriptions on File Prior to Visit  Medication Sig Dispense Refill  . Diclofenac Sodium (PENNSAID) 2 % SOLN Place 2 application onto the skin 2 (two) times daily. 112 g 3  . gabapentin (NEURONTIN) 300 MG capsule nightly 30 capsule 3  . meloxicam (MOBIC) 15 MG tablet Take 1 tablet (15 mg total) by mouth daily. 90 tablet 0  . Vitamin D, Ergocalciferol, (DRISDOL) 50000 units CAPS capsule Take 1 capsule (50,000 Units total) by mouth every 7 (seven) days. 12 capsule 0   No current facility-administered medications on file prior to visit.     Past Medical History:  Diagnosis Date  . Cervical dysplasia   . CRP elevated   . Hypercholesteremia     Past Surgical History:  Procedure Laterality Date  . CERVICAL BIOPSY  W/ LOOP ELECTRODE EXCISION  10/04   LGSIL  . CESAREAN SECTION    . OOPHORECTOMY     ? RSO, DERMOID    Social History   Social History  . Marital status: Married    Spouse name: N/A  . Number of children: N/A  . Years of education: N/A   Social History Main Topics  . Smoking status: Never Smoker  . Smokeless tobacco: Never Used  . Alcohol use 4.2 oz/week    7 Standard drinks or equivalent per week  . Drug use: No  . Sexual activity: Yes    Birth control/ protection:        Comment: 1st intercourse 56 yo-Fewer than 5 partners   Other Topics Concern  . None   Social History Narrative   Exercise - regularl    Family History  Problem Relation Age of Onset  . Cancer Mother        ov/ut  . Breast cancer Mother [id number]  . Hypertension Mother   . Heart disease Father   . Hypertension Father   . Diabetes Brother   . Kidney disease Brother   . Breast cancer Sister        Age 57's  . Cancer Sister        ov/ut    Review of Systems  Constitutional: Negative for chills and fever.  Eyes: Positive for visual disturbance (film over eye - couple of times only).  Respiratory: Negative for cough, shortness of breath and wheezing.   Cardiovascular: Negative for chest pain, palpitations and leg swelling.  Gastrointestinal: Negative for abdominal pain, blood in stool, constipation, diarrhea and nausea.       No gerd  Genitourinary: Negative for dysuria and hematuria.  Musculoskeletal: Positive for arthralgias.  Skin: Negative for color change and rash.  Neurological: Negative for light-headedness and headaches.  Psychiatric/Behavioral: Negative for  dysphoric mood. The patient is not nervous/anxious.        Objective:   Vitals:   07/25/17 1338  BP: 124/86  Pulse: 65  Resp: 16  Temp: 98.8 F (37.1 C)  SpO2: 98%   Filed Weights   07/25/17 1338  Weight: 164 lb (74.4 kg)   Body mass index is 23.53 kg/m.  Wt Readings from Last 3 Encounters:  07/25/17 164 lb (74.4 kg)  07/10/17 167 lb (75.8 kg)  05/22/17 170 lb (77.1 kg)     Physical Exam Constitutional: She appears well-developed and well-nourished. No distress.  HENT:  Head: Normocephalic and atraumatic.  Right Ear: External ear normal. Normal ear canal and TM Left Ear: External ear normal.  Normal ear canal and TM Mouth/Throat: Oropharynx is clear and moist.  Eyes: Conjunctivae and EOM are normal.  Neck: Neck supple. No tracheal deviation present. No thyromegaly present.  No carotid bruit   Cardiovascular: Normal rate, regular rhythm and normal heart sounds.   No murmur heard.  No edema. Pulmonary/Chest: Effort normal and breath sounds normal. No respiratory distress. She has no wheezes. She has no rales.  Breast: deferred to Gyn Abdominal: Soft. She exhibits no distension. There is no tenderness.  Lymphadenopathy: She has no cervical adenopathy.  Skin: Skin is warm and dry. She is not diaphoretic.  Psychiatric: She has a normal mood and affect. Her behavior is normal.         Assessment & Plan:   Physical exam: Screening blood work ordered Immunizations  Flu today , discussed shingrix Colonoscopy   - had cologuard 2017 - due 2020 Mammogram   Up to date  Gyn    Up to date  Eye exams  Up to date  EKG   None on file Exercise  Not regular due to hip pain Weight   Normal BMI Skin   No concerns Substance abuse   none  See Problem List for Assessment and Plan of chronic medical problems.   FU annually

## 2017-07-24 NOTE — Patient Instructions (Addendum)
Test(s) ordered today. Your results will be released to [name] (or called to you) after review, usually within 72hours after test completion. If any changes need to be made, you will be notified at that same time.  All other Health Maintenance issues reviewed.   All recommended immunizations and age-appropriate screenings are up-to-date or discussed.  Flu immunization administered today.   Medications reviewed and updated.  Changes include starting a muscle relaxer - methocarbamol.    Your prescription(s) have been submitted to your pharmacy. Please take as directed and contact our office if you believe you are having problem(s) with the medication(s).   Please followup in one year   Health Maintenance, Female Adopting a healthy lifestyle and getting preventive care can go a long way to promote health and wellness. Talk with your health care provider about what schedule of regular examinations is right for you. This is a good chance for you to check in with your provider about disease prevention and staying healthy. In between checkups, there are plenty of things you can do on your own. Experts have done a lot of research about which lifestyle changes and preventive measures are most likely to keep you healthy. Ask your health care provider for more information. Weight and diet Eat a healthy diet  Be sure to include plenty of vegetables, fruits, low-fat dairy products, and lean protein.  Do not eat a lot of foods high in solid fats, added sugars, or salt.  Get regular exercise. This is one of the most important things you can do for your health. ? Most adults should exercise for at least 150 minutes each week. The exercise should increase your heart rate and make you sweat (moderate-intensity exercise). ? Most adults should also do strengthening exercises at least twice a week. This is in addition to the moderate-intensity exercise.  Maintain a healthy weight  Body mass index (BMI)  is a measurement that can be used to identify possible weight problems. It estimates body fat based on height and weight. Your health care provider can help determine your BMI and help you achieve or maintain a healthy weight.  For females 64 years of age and older: ? A BMI below 18.5 is considered underweight. ? A BMI of 18.5 to 24.9 is normal. ? A BMI of 25 to 29.9 is considered overweight. ? A BMI of 30 and above is considered obese.  Watch levels of cholesterol and blood lipids  You should start having your blood tested for lipids and cholesterol at 57 years of age, then have this test every 5 years.  You may need to have your cholesterol levels checked more often if: ? Your lipid or cholesterol levels are high. ? You are older than 57 years of age. ? You are at high risk for heart disease.  Cancer screening Lung Cancer  Lung cancer screening is recommended for adults 80-42 years old who are at high risk for lung cancer because of a history of smoking.  A yearly low-dose CT scan of the lungs is recommended for people who: ? Currently smoke. ? Have quit within the past 15 years. ? Have at least a 30-pack-year history of smoking. A pack year is smoking an average of one pack of cigarettes a day for 1 year.  Yearly screening should continue until it has been 15 years since you quit.  Yearly screening should stop if you develop a health problem that would prevent you from having lung cancer treatment.  Breast Cancer  Practice breast self-awareness. This means understanding how your breasts normally appear and feel.  It also means doing regular breast self-exams. Let your health care provider know about any changes, no matter how small.  If you are in your 20s or 30s, you should have a clinical breast exam (CBE) by a health care provider every 1-3 years as part of a regular health exam.  If you are 40 or older, have a CBE every year. Also consider having a breast X-ray  (mammogram) every year.  If you have a family history of breast cancer, talk to your health care provider about genetic screening.  If you are at high risk for breast cancer, talk to your health care provider about having an MRI and a mammogram every year.  Breast cancer gene (BRCA) assessment is recommended for women who have family members with BRCA-related cancers. BRCA-related cancers include: ? Breast. ? Ovarian. ? Tubal. ? Peritoneal cancers.  Results of the assessment will determine the need for genetic counseling and BRCA1 and BRCA2 testing.  Cervical Cancer Your health care provider may recommend that you be screened regularly for cancer of the pelvic organs (ovaries, uterus, and vagina). This screening involves a pelvic examination, including checking for microscopic changes to the surface of your cervix (Pap test). You may be encouraged to have this screening done every 3 years, beginning at age 21.  For women ages 30-65, health care providers may recommend pelvic exams and Pap testing every 3 years, or they may recommend the Pap and pelvic exam, combined with testing for human papilloma virus (HPV), every 5 years. Some types of HPV increase your risk of cervical cancer. Testing for HPV may also be done on women of any age with unclear Pap test results.  Other health care providers may not recommend any screening for nonpregnant women who are considered low risk for pelvic cancer and who do not have symptoms. Ask your health care provider if a screening pelvic exam is right for you.  If you have had past treatment for cervical cancer or a condition that could lead to cancer, you need Pap tests and screening for cancer for at least 20 years after your treatment. If Pap tests have been discontinued, your risk factors (such as having a new sexual partner) need to be reassessed to determine if screening should resume. Some women have medical problems that increase the chance of getting  cervical cancer. In these cases, your health care provider may recommend more frequent screening and Pap tests.  Colorectal Cancer  This type of cancer can be detected and often prevented.  Routine colorectal cancer screening usually begins at 57 years of age and continues through 57 years of age.  Your health care provider may recommend screening at an earlier age if you have risk factors for colon cancer.  Your health care provider may also recommend using home test kits to check for hidden blood in the stool.  A small camera at the end of a tube can be used to examine your colon directly (sigmoidoscopy or colonoscopy). This is done to check for the earliest forms of colorectal cancer.  Routine screening usually begins at age 50.  Direct examination of the colon should be repeated every 5-10 years through 57 years of age. However, you may need to be screened more often if early forms of precancerous polyps or small growths are found.  Skin Cancer  Check your skin from head to toe regularly.  Tell your health care   provider about any new moles or changes in moles, especially if there is a change in a mole's shape or color.  Also tell your health care provider if you have a mole that is larger than the size of a pencil eraser.  Always use sunscreen. Apply sunscreen liberally and repeatedly throughout the day.  Protect yourself by wearing long sleeves, pants, a wide-brimmed hat, and sunglasses whenever you are outside.  Heart disease, diabetes, and high blood pressure  High blood pressure causes heart disease and increases the risk of stroke. High blood pressure is more likely to develop in: ? People who have blood pressure in the high end of the normal range (130-139/85-89 mm Hg). ? People who are overweight or obese. ? People who are African American.  If you are 18-39 years of age, have your blood pressure checked every 3-5 years. If you are 40 years of age or older, have your  blood pressure checked every year. You should have your blood pressure measured twice-once when you are at a hospital or clinic, and once when you are not at a hospital or clinic. Record the average of the two measurements. To check your blood pressure when you are not at a hospital or clinic, you can use: ? An automated blood pressure machine at a pharmacy. ? A home blood pressure monitor.  If you are between 55 years and 79 years old, ask your health care provider if you should take aspirin to prevent strokes.  Have regular diabetes screenings. This involves taking a blood sample to check your fasting blood sugar level. ? If you are at a normal weight and have a low risk for diabetes, have this test once every three years after 57 years of age. ? If you are overweight and have a high risk for diabetes, consider being tested at a younger age or more often. Preventing infection Hepatitis B  If you have a higher risk for hepatitis B, you should be screened for this virus. You are considered at high risk for hepatitis B if: ? You were born in a country where hepatitis B is common. Ask your health care provider which countries are considered high risk. ? Your parents were born in a high-risk country, and you have not been immunized against hepatitis B (hepatitis B vaccine). ? You have HIV or AIDS. ? You use needles to inject street drugs. ? You live with someone who has hepatitis B. ? You have had sex with someone who has hepatitis B. ? You get hemodialysis treatment. ? You take certain medicines for conditions, including cancer, organ transplantation, and autoimmune conditions.  Hepatitis C  Blood testing is recommended for: ? Everyone born from 1945 through 1965. ? Anyone with known risk factors for hepatitis C.  Sexually transmitted infections (STIs)  You should be screened for sexually transmitted infections (STIs) including gonorrhea and chlamydia if: ? You are sexually active and  are younger than 57 years of age. ? You are older than 57 years of age and your health care provider tells you that you are at risk for this type of infection. ? Your sexual activity has changed since you were last screened and you are at an increased risk for chlamydia or gonorrhea. Ask your health care provider if you are at risk.  If you do not have HIV, but are at risk, it may be recommended that you take a prescription medicine daily to prevent HIV infection. This is called pre-exposure prophylaxis (PrEP). You   are considered at risk if: ? You are sexually active and do not regularly use condoms or know the HIV status of your partner(s). ? You take drugs by injection. ? You are sexually active with a partner who has HIV.  Talk with your health care provider about whether you are at high risk of being infected with HIV. If you choose to begin PrEP, you should first be tested for HIV. You should then be tested every 3 months for as long as you are taking PrEP. Pregnancy  If you are premenopausal and you may become pregnant, ask your health care provider about preconception counseling.  If you may become pregnant, take 400 to 800 micrograms (mcg) of folic acid every day.  If you want to prevent pregnancy, talk to your health care provider about birth control (contraception). Osteoporosis and menopause  Osteoporosis is a disease in which the bones lose minerals and strength with aging. This can result in serious bone fractures. Your risk for osteoporosis can be identified using a bone density scan.  If you are 65 years of age or older, or if you are at risk for osteoporosis and fractures, ask your health care provider if you should be screened.  Ask your health care provider whether you should take a calcium or vitamin D supplement to lower your risk for osteoporosis.  Menopause may have certain physical symptoms and risks.  Hormone replacement therapy may reduce some of these symptoms and  risks. Talk to your health care provider about whether hormone replacement therapy is right for you. Follow these instructions at home:  Schedule regular health, dental, and eye exams.  Stay current with your immunizations.  Do not use any tobacco products including cigarettes, chewing tobacco, or electronic cigarettes.  If you are pregnant, do not drink alcohol.  If you are breastfeeding, limit how much and how often you drink alcohol.  Limit alcohol intake to no more than 1 drink per day for nonpregnant women. One drink equals 12 ounces of beer, 5 ounces of wine, or 1 ounces of hard liquor.  Do not use street drugs.  Do not share needles.  Ask your health care provider for help if you need support or information about quitting drugs.  Tell your health care provider if you often feel depressed.  Tell your health care provider if you have ever been abused or do not feel safe at home. This information is not intended to replace advice given to you by your health care provider. Make sure you discuss any questions you have with your health care provider. Document Released: 05/15/2011 Document Revised: 04/06/2016 Document Reviewed: 08/03/2015 Elsevier Interactive Patient Education  2018 [employer]vier Inc.  

## 2017-07-25 ENCOUNTER — Ambulatory Visit (INDEPENDENT_AMBULATORY_CARE_PROVIDER_SITE_OTHER): Payer: BLUE CROSS/BLUE SHIELD | Admitting: Family Medicine

## 2017-07-25 ENCOUNTER — Other Ambulatory Visit (INDEPENDENT_AMBULATORY_CARE_PROVIDER_SITE_OTHER): Payer: BLUE CROSS/BLUE SHIELD

## 2017-07-25 ENCOUNTER — Encounter: Payer: Self-pay | Admitting: Family Medicine

## 2017-07-25 ENCOUNTER — Encounter: Payer: Self-pay | Admitting: Internal Medicine

## 2017-07-25 ENCOUNTER — Ambulatory Visit (INDEPENDENT_AMBULATORY_CARE_PROVIDER_SITE_OTHER): Payer: BLUE CROSS/BLUE SHIELD | Admitting: Internal Medicine

## 2017-07-25 VITALS — BP 124/86 | HR 65 | Temp 98.8°F | Resp 16 | Ht 70.0 in | Wt 164.0 lb

## 2017-07-25 VITALS — BP 124/86 | HR 65 | Ht 70.0 in | Wt 164.0 lb

## 2017-07-25 DIAGNOSIS — R739 Hyperglycemia, unspecified: Secondary | ICD-10-CM | POA: Diagnosis not present

## 2017-07-25 DIAGNOSIS — E7800 Pure hypercholesterolemia, unspecified: Secondary | ICD-10-CM

## 2017-07-25 DIAGNOSIS — Z0000 Encounter for general adult medical examination without abnormal findings: Secondary | ICD-10-CM | POA: Diagnosis not present

## 2017-07-25 DIAGNOSIS — M1611 Unilateral primary osteoarthritis, right hip: Secondary | ICD-10-CM | POA: Diagnosis not present

## 2017-07-25 DIAGNOSIS — M62838 Other muscle spasm: Secondary | ICD-10-CM | POA: Insufficient documentation

## 2017-07-25 DIAGNOSIS — Z23 Encounter for immunization: Secondary | ICD-10-CM

## 2017-07-25 DIAGNOSIS — M5416 Radiculopathy, lumbar region: Secondary | ICD-10-CM | POA: Diagnosis not present

## 2017-07-25 LAB — LIPID PANEL
Cholesterol: 247 mg/dL — ABNORMAL HIGH (ref 0–200)
HDL: 86.1 mg/dL (ref >39.00)
LDL Cholesterol: 148 mg/dL — ABNORMAL HIGH (ref 0–99)
NONHDL: 160.61
Total CHOL/HDL Ratio: 3
Triglycerides: 65 mg/dL (ref 0.0–149.0)
VLDL: 13 mg/dL (ref 0.0–40.0)

## 2017-07-25 LAB — COMPREHENSIVE METABOLIC PANEL
ALK PHOS: 60 U/L (ref 39–117)
ALT: 17 U/L (ref 0–35)
AST: 19 U/L (ref 0–37)
Albumin: 4.8 g/dL (ref 3.5–5.2)
BUN: 14 mg/dL (ref 6–23)
CO2: 26 mEq/L (ref 19–32)
Calcium: 10.2 mg/dL (ref 8.4–10.5)
Chloride: 103 mEq/L (ref 96–112)
Creatinine, Ser: 0.84 mg/dL (ref 0.40–1.20)
GFR: 74.1 mL/min (ref >60.00)
GLUCOSE: 92 mg/dL (ref 70–99)
POTASSIUM: 4.2 meq/L (ref 3.5–5.1)
Sodium: 135 mEq/L (ref 135–145)
Total Bilirubin: 1 mg/dL (ref 0.2–1.2)
Total Protein: 7.5 g/dL (ref 6.0–8.3)

## 2017-07-25 LAB — CBC WITH DIFFERENTIAL/PLATELET
BASOS PCT: 0.8 % (ref 0.0–3.0)
Basophils Absolute: 0.1 10*3/uL (ref 0.0–0.1)
EOS PCT: 1.1 % (ref 0.0–5.0)
Eosinophils Absolute: 0.1 10*3/uL (ref 0.0–0.7)
HCT: 44.6 % (ref 36.0–46.0)
Hemoglobin: 15 g/dL (ref 12.0–15.0)
LYMPHS ABS: 2.9 10*3/uL (ref 0.7–4.0)
Lymphocytes Relative: 39.7 % (ref 12.0–46.0)
MCHC: 33.6 g/dL (ref 30.0–36.0)
MCV: 92.1 fl (ref 78.0–100.0)
MONOS PCT: 8 % (ref 3.0–12.0)
Monocytes Absolute: 0.6 10*3/uL (ref 0.1–1.0)
NEUTROS ABS: 3.7 10*3/uL (ref 1.4–7.7)
Neutrophils Relative %: 50.4 % (ref 43.0–77.0)
PLATELETS: 269 10*3/uL (ref 150.0–400.0)
RBC: 4.84 Mil/uL (ref 3.87–5.11)
RDW: 13.5 % (ref 11.5–15.5)
WBC: 7.3 10*3/uL (ref 4.0–10.5)

## 2017-07-25 LAB — HEMOGLOBIN A1C: Hgb A1c MFr Bld: 5.1 % (ref 4.6–6.5)

## 2017-07-25 LAB — TSH: TSH: 1.7 u[IU]/mL (ref 0.35–4.50)

## 2017-07-25 MED ORDER — TRAMADOL HCL 50 MG PO TABS: 50.0000 mg | ORAL_TABLET | Freq: Four times a day (QID) | ORAL | 0 refills | Status: DC | PRN

## 2017-07-25 MED ORDER — METHOCARBAMOL 500 MG PO TABS: 500.0000 mg | ORAL_TABLET | Freq: Four times a day (QID) | ORAL | 1 refills | Status: DC | PRN

## 2017-07-25 NOTE — Assessment & Plan Note (Signed)
At  Night, likely related to hip problems Will prescribe methocarbamol and likely will only need to take it at night- she will discuss this with Dr Katrinka BlazingSmith and see if he has any further suggestions

## 2017-07-25 NOTE — Assessment & Plan Note (Signed)
Elevated in past Not currently exercising due to severe right hip pain Would like to avoid medication Check lipid panel, cmp, tsh

## 2017-07-25 NOTE — Patient Instructions (Signed)
Good to see you  Ice is your friend Tramadol up to 2 times daily with  of tylenol  Keep doing the other exercises Lets do the MRI of the back and I will write you  Move forward wit  the hip

## 2017-07-25 NOTE — Assessment & Plan Note (Signed)
Patient is having some lumbar radiculopathy. We discussed with patient at great length. We discussed icing regimen, patient is failed all conservative therapy. Severe arthritis of the right hip but did not respond to the injection. I still feel that that would likely give some benefit if she did have a replacement. Feeling MRI is necessary with her continuing to have pain is debilitating. Patient's will be a candidate for an epidural injection. We will discuss with patient at great length. Patient will start tramadol

## 2017-07-25 NOTE — Assessment & Plan Note (Signed)
Patient did not respond to the injection. Patient does have severe osteophytic changes of the hip and it is still to continue with the evaluation by orthopedic surgery for potential replacement.

## 2017-07-25 NOTE — Assessment & Plan Note (Signed)
Check a1c 

## 2017-07-25 NOTE — Progress Notes (Signed)
Tawana ScaleZach Christin Moline D.O. [facility] [address] [place], [place] [address] Phone: [phone] Subjective:    I'm seeing this patient by the request  of:    CC: Right hip and leg pain  BJY:NWGNFAOZHYHPI:Subjective  Carly FettersSandra J Baker is a 56 y.o. female coming in with complaint of right hip and leg pain. Patient has had known back pain as well. Patient does have severe hip arthritis. Elected try an injection 2 weeks ago. States that she is tendon 20% better but still having significant amount of pain overall. Patient states that this can come with some mild numbness. States that in continues to have more of an aching sensation down the back as well. Sometimes the pain can be unrelenting. Patient is concern is affecting daily activities.   Previous back imaging does show that patient does have diffuse osteopenia with degenerative changes from L2-L3 with a 6 mm retrolisthesis of L3 on L4 Past Medical History:  Diagnosis Date  . Cervical dysplasia   . CRP elevated   . Hypercholesteremia    Past Surgical History:  Procedure Laterality Date  . CERVICAL BIOPSY  W/ LOOP ELECTRODE EXCISION  10/04   LGSIL  . CESAREAN SECTION    . OOPHORECTOMY     ? RSO, DERMOID   Social History   Social History  . Marital status: Married    Spouse name: N/A  . Number of children: N/A  . Years of education: N/A   Social History Main Topics  . Smoking status: Never Smoker  . Smokeless tobacco: Never Used  . Alcohol use 4.2 oz/week    7 Standard drinks or equivalent per week  . Drug use: No  . Sexual activity: Yes    Birth control/ protection:      Comment: 1st intercourse 56 yo-Fewer than 5 partners   Other Topics Concern  . None   Social History Narrative   Exercise - regularl   No Known Allergies Family History  Problem Relation Age of Onset  . Cancer Mother        ov/ut  . Breast cancer Mother [id number]  . Hypertension Mother   . Heart disease Father   . Hypertension Father   . Diabetes Brother     . Kidney disease Brother   . Breast cancer Sister        Age 57's  . Cancer Sister        ov/ut     Past medical history, social, surgical and family history all reviewed in electronic medical record.  No pertanent information unless stated regarding to the chief complaint.   Review of Systems:Review of systems updated and as accurate as of 07/25/17  No headache, visual changes, nausea, vomiting, diarrhea, constipation, dizziness, abdominal pain, skin rash, fevers, chills, night sweats, weight loss, swollen lymph nodes, body aches, joint swelling,  chest pain, shortness of breath, mood changes. Positive muscle aches  Objective  Blood pressure 124/86, pulse 65, height 5\' 10"  (1.778 m), weight 164 lb (74.4 kg), last menstrual period 08/13/2012, SpO2 98 %. Systems examined below as of 07/25/17   General: No apparent distress alert and oriented x3 mood and affect normal, dressed appropriately.  HEENT: Pupils equal, extraocular movements intact  Respiratory: Patient's speak in full sentences and does not appear short of breath  Cardiovascular: No lower extremity edema, non tender, no erythema  Skin: Warm dry intact with no signs of infection or rash on extremities or on axial skeleton.  Abdomen: Soft nontender  Neuro: Cranial nerves II through XII are intact, neurovascularly intact in all extremities with 2+ DTRs and 2+ pulses.  Lymph: No lymphadenopathy of posterior or anterior cervical chain or axillae bilaterally.  Gait normal with good balance and coordination.  MSK:  Non tender with full range of motion and good stability and symmetric strength and tone of shoulders, elbows, wrist,  knee and ankles bilaterally.  Right hip exam still shows the patient has no internal range of motion with significant pain with any type of internal range of motion in the right groin area. Patient does have a mild positive straight leg test. Strength is 4 out of 5 with hip flexion. Patient back does seem to  have more pain with patient tried to do some extension.  Reflexes are intact.   Impression and Recommendations:     This case required medical decision making of moderate complexity.      Note: This dictation was prepared with Dragon dictation along with smaller phrase technology. Any transcriptional errors that result from this process are unintentional.

## 2017-07-26 ENCOUNTER — Encounter: Payer: Self-pay | Admitting: Internal Medicine

## 2017-08-12 ENCOUNTER — Other Ambulatory Visit: Payer: Self-pay | Admitting: Family Medicine

## 2017-08-13 NOTE — Telephone Encounter (Signed)
Refill done.  

## 2017-09-15 ENCOUNTER — Other Ambulatory Visit: Payer: Self-pay | Admitting: Family Medicine

## 2017-09-29 ENCOUNTER — Other Ambulatory Visit: Payer: Self-pay | Admitting: Family Medicine

## 2017-10-09 ENCOUNTER — Telehealth: Payer: Self-pay | Admitting: Internal Medicine

## 2017-10-09 NOTE — Telephone Encounter (Signed)
Copied from CRM [id number]. Topic: Quick Communication - Rx Refill/Question >> Oct 09, 2017 11:47 AM Crist InfanteHarrald, Kathy J wrote: Pt would like to know how to wean herself off of the gabapentin (NEURONTIN) 300 MG capsule Pt had her surgery on 11/15   Agent: Please be advised that RX refills may take up to 48 hours. We ask that you follow-up with your pharmacy.

## 2017-10-09 NOTE — Telephone Encounter (Signed)
Spoke with pts spouse to advise per MD she does not need to wean herself off the Gabapentin. He states he will let her know.

## 2017-10-09 NOTE — Telephone Encounter (Signed)
Patient wants to taper off the Neurontin and needs to know how to do it. She knows not to abruptly stop. Please call and let her know.

## 2017-10-09 NOTE — Telephone Encounter (Signed)
Please advise and call patient back.

## 2017-12-05 ENCOUNTER — Encounter: Payer: BLUE CROSS/BLUE SHIELD | Admitting: Gynecology

## 2017-12-05 ENCOUNTER — Encounter: Payer: Self-pay | Admitting: Gynecology

## 2017-12-05 ENCOUNTER — Ambulatory Visit: Payer: BLUE CROSS/BLUE SHIELD | Admitting: Gynecology

## 2017-12-05 VITALS — BP 114/70 | Ht 69.0 in | Wt 156.0 lb

## 2017-12-05 DIAGNOSIS — N952 Postmenopausal atrophic vaginitis: Secondary | ICD-10-CM

## 2017-12-05 DIAGNOSIS — Z01411 Encounter for gynecological examination (general) (routine) with abnormal findings: Secondary | ICD-10-CM | POA: Diagnosis not present

## 2017-12-05 NOTE — Patient Instructions (Signed)
Follow-up in 1 year for annual exam, sooner if any issues.  Schedule your mammogram

## 2017-12-05 NOTE — Addendum Note (Signed)
Addended by: Dayna BarkerGARDNER, Moksh Loomer K on: 12/05/2017 03:33 PM   Modules accepted: Orders

## 2017-12-05 NOTE — Progress Notes (Signed)
    Carly FettersSandra J Baker 1960-12-08 [id number]        [age] y.o.  [id number] for annual gynecologic exam.  Doing well from a gynecologic standpoint.  Past medical history,surgical history, problem list, medications, allergies, family history and social history were all reviewed and documented as reviewed in the EPIC chart.  ROS:  Performed with pertinent positives and negatives included in the history, assessment and plan.   Additional significant findings : None   Exam: Kennon PortelaKim Gardner assistant Vitals:   12/05/17 1452  BP: 114/70  Weight: 156 lb (70.8 kg)  Height: 5\' 9"  (1.753 m)   Body mass index is 23.04 kg/m.  General appearance:  Normal affect, orientation and appearance. Skin: Grossly normal HEENT: Without gross lesions.  No cervical or supraclavicular adenopathy. Thyroid normal.  Lungs:  Clear without wheezing, rales or rhonchi Cardiac: RR, without RMG Abdominal:  Soft, nontender, without masses, guarding, rebound, organomegaly or hernia Breasts:  Examined lying and sitting without masses, retractions, discharge or axillary adenopathy. Pelvic:  Ext, BUS, Vagina: With atrophic changes  Cervix: With atrophic changes  Uterus: Anteverted, normal size, shape and contour, midline and mobile nontender   Adnexa: Without masses or tenderness    Anus and perineum: Normal   Rectovaginal: Normal sphincter tone without palpated masses or tenderness.    Assessment/Plan:  57 y.o. [id number] female for annual gynecologic exam.   1. Postmenopausal.  Was having significant postmenopausal symptoms last year but notes they seem to have gotten better on their own.  No bleeding or other issues. 2. Strong family history of breast cancer.  Patient was to do genetic counseling last year but consult.  She is not interested in doing this at this time.  We have discussed the issues of genetic linkage in the high risk of breast cancer or ovarian cancer if positive.  Benefits of increased surveillance or  prophylactic surgeries have been discussed.  The patient at this point declines any referral understanding the issues.  Last mammogram 08/2016.  Patient reminded she is overdue and agrees to call and schedule.  Breast exam normal today. 3. Pap smear 08/2015.  Pap smear done today.  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  Normal Pap smears afterwards. 4. Colonoscopy never.  Has done Cologuard through her primary physician's office.  Declines colonoscopy.  She will continue to follow-up with her primary physician in reference to: Screening. 5. Health maintenance.  No routine lab work done as patient does this elsewhere.  Follow-up 1 year, sooner as needed. 6.    Nadyne Coombesimothy P Adelayde Minney MD, 3:21 PM 12/05/2017

## 2017-12-07 LAB — PAP IG W/ RFLX HPV ASCU

## 2018-08-01 NOTE — Patient Instructions (Addendum)
Tests ordered today. Your results will be released to [place] (or called to you) after review, usually within 72hours after test completion. If any changes need to be made, you will be notified at that same time.  All other Health Maintenance issues reviewed.   All recommended immunizations and age-appropriate screenings are up-to-date or discussed.  Flu immunization administered today.    Medications reviewed and updated.  Changes include :   none   Please followup in one year   Health Maintenance, Female Adopting a healthy lifestyle and getting preventive care can go a long way to promote health and wellness. Talk with your health care provider about what schedule of regular examinations is right for you. This is a good chance for you to check in with your provider about disease prevention and staying healthy. In between checkups, there are plenty of things you can do on your own. Experts have done a lot of research about which lifestyle changes and preventive measures are most likely to keep you healthy. Ask your health care provider for more information. Weight and diet Eat a healthy diet  Be sure to include plenty of vegetables, fruits, low-fat dairy products, and lean protein.  Do not eat a lot of foods high in solid fats, added sugars, or salt.  Get regular exercise. This is one of the most important things you can do for your health. ? Most adults should exercise for at least 150 minutes each week. The exercise should increase your heart rate and make you sweat (moderate-intensity exercise). ? Most adults should also do strengthening exercises at least twice a week. This is in addition to the moderate-intensity exercise.  Maintain a healthy weight  Body mass index (BMI) is a measurement that can be used to identify possible weight problems. It estimates body fat based on height and weight. Your health care provider can help determine your BMI and help you achieve or maintain a  healthy weight.  For females 66 years of age and older: ? A BMI below 18.5 is considered underweight. ? A BMI of 18.5 to 24.9 is normal. ? A BMI of 25 to 29.9 is considered overweight. ? A BMI of 30 and above is considered obese.  Watch levels of cholesterol and blood lipids  You should start having your blood tested for lipids and cholesterol at 58 years of age, then have this test every 5 years.  You may need to have your cholesterol levels checked more often if: ? Your lipid or cholesterol levels are high. ? You are older than 57 years of age. ? You are at high risk for heart disease.  Cancer screening Lung Cancer  Lung cancer screening is recommended for adults 60-48 years old who are at high risk for lung cancer because of a history of smoking.  A yearly low-dose CT scan of the lungs is recommended for people who: ? Currently smoke. ? Have quit within the past 15 years. ? Have at least a 30-pack-year history of smoking. A pack year is smoking an average of one pack of cigarettes a day for 1 year.  Yearly screening should continue until it has been 15 years since you quit.  Yearly screening should stop if you develop a health problem that would prevent you from having lung cancer treatment.  Breast Cancer  Practice breast self-awareness. This means understanding how your breasts normally appear and feel.  It also means doing regular breast self-exams. Let your health care provider know about any  changes, no matter how small.  If you are in your 20s or 30s, you should have a clinical breast exam (CBE) by a health care provider every 1-3 years as part of a regular health exam.  If you are 17 or older, have a CBE every year. Also consider having a breast X-ray (mammogram) every year.  If you have a family history of breast cancer, talk to your health care provider about genetic screening.  If you are at high risk for breast cancer, talk to your health care provider about  having an MRI and a mammogram every year.  Breast cancer gene (BRCA) assessment is recommended for women who have family members with BRCA-related cancers. BRCA-related cancers include: ? Breast. ? Ovarian. ? Tubal. ? Peritoneal cancers.  Results of the assessment will determine the need for genetic counseling and BRCA1 and BRCA2 testing.  Cervical Cancer Your health care provider may recommend that you be screened regularly for cancer of the pelvic organs (ovaries, uterus, and vagina). This screening involves a pelvic examination, including checking for microscopic changes to the surface of your cervix (Pap test). You may be encouraged to have this screening done every 3 years, beginning at age 16.  For women ages 22-65, health care providers may recommend pelvic exams and Pap testing every 3 years, or they may recommend the Pap and pelvic exam, combined with testing for human papilloma virus (HPV), every 5 years. Some types of HPV increase your risk of cervical cancer. Testing for HPV may also be done on women of any age with unclear Pap test results.  Other health care providers may not recommend any screening for nonpregnant women who are considered low risk for pelvic cancer and who do not have symptoms. Ask your health care provider if a screening pelvic exam is right for you.  If you have had past treatment for cervical cancer or a condition that could lead to cancer, you need Pap tests and screening for cancer for at least 20 years after your treatment. If Pap tests have been discontinued, your risk factors (such as having a new sexual partner) need to be reassessed to determine if screening should resume. Some women have medical problems that increase the chance of getting cervical cancer. In these cases, your health care provider may recommend more frequent screening and Pap tests.  Colorectal Cancer  This type of cancer can be detected and often prevented.  Routine colorectal cancer  screening usually begins at 58 years of age and continues through 58 years of age.  Your health care provider may recommend screening at an earlier age if you have risk factors for colon cancer.  Your health care provider may also recommend using home test kits to check for hidden blood in the stool.  A small camera at the end of a tube can be used to examine your colon directly (sigmoidoscopy or colonoscopy). This is done to check for the earliest forms of colorectal cancer.  Routine screening usually begins at age 65.  Direct examination of the colon should be repeated every 5-10 years through 58 years of age. However, you may need to be screened more often if early forms of precancerous polyps or small growths are found.  Skin Cancer  Check your skin from head to toe regularly.  Tell your health care provider about any new moles or changes in moles, especially if there is a change in a mole's shape or color.  Also tell your health care provider if  you have a mole that is larger than the size of a pencil eraser.  Always use sunscreen. Apply sunscreen liberally and repeatedly throughout the day.  Protect yourself by wearing long sleeves, pants, a wide-brimmed hat, and sunglasses whenever you are outside.  Heart disease, diabetes, and high blood pressure  High blood pressure causes heart disease and increases the risk of stroke. High blood pressure is more likely to develop in: ? People who have blood pressure in the high end of the normal range (130-139/85-89 mm Hg). ? People who are overweight or obese. ? People who are African American.  If you are 31-27 years of age, have your blood pressure checked every 3-5 years. If you are 14 years of age or older, have your blood pressure checked every year. You should have your blood pressure measured twice-once when you are at a hospital or clinic, and once when you are not at a hospital or clinic. Record the average of the two measurements.  To check your blood pressure when you are not at a hospital or clinic, you can use: ? An automated blood pressure machine at a pharmacy. ? A home blood pressure monitor.  If you are between 53 years and 33 years old, ask your health care provider if you should take aspirin to prevent strokes.  Have regular diabetes screenings. This involves taking a blood sample to check your fasting blood sugar level. ? If you are at a normal weight and have a low risk for diabetes, have this test once every three years after 58 years of age. ? If you are overweight and have a high risk for diabetes, consider being tested at a younger age or more often. Preventing infection Hepatitis B  If you have a higher risk for hepatitis B, you should be screened for this virus. You are considered at high risk for hepatitis B if: ? You were born in a country where hepatitis B is common. Ask your health care provider which countries are considered high risk. ? Your parents were born in a high-risk country, and you have not been immunized against hepatitis B (hepatitis B vaccine). ? You have HIV or AIDS. ? You use needles to inject street drugs. ? You live with someone who has hepatitis B. ? You have had sex with someone who has hepatitis B. ? You get hemodialysis treatment. ? You take certain medicines for conditions, including cancer, organ transplantation, and autoimmune conditions.  Hepatitis C  Blood testing is recommended for: ? Everyone born from [birth year] through 1966. ? Anyone with known risk factors for hepatitis C.  Sexually transmitted infections (STIs)  You should be screened for sexually transmitted infections (STIs) including gonorrhea and chlamydia if: ? You are sexually active and are younger than 58 years of age. ? You are older than 58 years of age and your health care provider tells you that you are at risk for this type of infection. ? Your sexual activity has changed since you were last screened  and you are at an increased risk for chlamydia or gonorrhea. Ask your health care provider if you are at risk.  If you do not have HIV, but are at risk, it may be recommended that you take a prescription medicine daily to prevent HIV infection. This is called pre-exposure prophylaxis (PrEP). You are considered at risk if: ? You are sexually active and do not regularly use condoms or know the HIV status of your partner(s). ? You take drugs by  injection. ? You are sexually active with a partner who has HIV.  Talk with your health care provider about whether you are at high risk of being infected with HIV. If you choose to begin PrEP, you should first be tested for HIV. You should then be tested every 3 months for as long as you are taking PrEP. Pregnancy  If you are premenopausal and you may become pregnant, ask your health care provider about preconception counseling.  If you may become pregnant, take 400 to 800 micrograms (mcg) of folic acid every day.  If you want to prevent pregnancy, talk to your health care provider about birth control (contraception). Osteoporosis and menopause  Osteoporosis is a disease in which the bones lose minerals and strength with aging. This can result in serious bone fractures. Your risk for osteoporosis can be identified using a bone density scan.  If you are 61 years of age or older, or if you are at risk for osteoporosis and fractures, ask your health care provider if you should be screened.  Ask your health care provider whether you should take a calcium or vitamin D supplement to lower your risk for osteoporosis.  Menopause may have certain physical symptoms and risks.  Hormone replacement therapy may reduce some of these symptoms and risks. Talk to your health care provider about whether hormone replacement therapy is right for you. Follow these instructions at home:  Schedule regular health, dental, and eye exams.  Stay current with your  immunizations.  Do not use any tobacco products including cigarettes, chewing tobacco, or electronic cigarettes.  If you are pregnant, do not drink alcohol.  If you are breastfeeding, limit how much and how often you drink alcohol.  Limit alcohol intake to no more than 1 drink per day for nonpregnant women. One drink equals 12 ounces of beer, 5 ounces of wine, or 1 ounces of hard liquor.  Do not use street drugs.  Do not share needles.  Ask your health care provider for help if you need support or information about quitting drugs.  Tell your health care provider if you often feel depressed.  Tell your health care provider if you have ever been abused or do not feel safe at home. This information is not intended to replace advice given to you by your health care provider. Make sure you discuss any questions you have with your health care provider. Document Released: 05/15/2011 Document Revised: 04/06/2016 Document Reviewed: 08/03/2015 Elsevier Interactive Patient Education  [employer].

## 2018-08-01 NOTE — Progress Notes (Signed)
Subjective:    Patient ID: [name], female    DOB: 16-Aug-1961, 57 y.o.   MRN: [id number]  HPI She is here for a physical exam.   She has no concerns and denies any changes in her health.  She is still working full time at a very stressful job and is helping a lot with her twin grandsons.  There is a lot of stress and some anxiety at times.  She is dealing with everything ok at this time.    Medications and allergies reviewed with patient and updated if appropriate.  Patient Active Problem List   Diagnosis Date Noted  . Muscle spasm 07/25/2017  . Lumbar radiculopathy 07/25/2017  . 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 Right hip pain 05/07/2017  . Right knee pain 11/07/2016  . Hypercholesteremia     No current outpatient medications on file prior to visit.   No current facility-administered medications on file prior to visit.     Past Medical History:  Diagnosis Date  . Cervical dysplasia   . CRP elevated   . Hypercholesteremia     Past Surgical History:  Procedure Laterality Date  . CERVICAL BIOPSY  W/ LOOP ELECTRODE EXCISION  10/04   LGSIL  . CESAREAN SECTION    . OOPHORECTOMY     ? RSO, DERMOID  . TOTAL HIP ARTHROPLASTY      Social History   Socioeconomic History  . Marital status: Married    Spouse name: Not on file  . Number of children: Not on file  . Years of education: Not on file  . Highest education level: Not on file  Occupational History  . Not on file  Social Needs  . Financial resource strain: Not on file  . Food insecurity:    Worry: Not on file    Inability: Not on file  . Transportation needs:    Medical: Not on file    Non-medical: Not on file  Tobacco Use  . Smoking status: Never Smoker  . Smokeless tobacco: Never Used  Substance and Sexual Activity  . Alcohol use: Yes    Alcohol/week: 7.0 standard drinks    Types: 7 Standard drinks or equivalent per week  . Drug use: No  . Sexual activity: Yes    Comment: 1st  intercourse 57 yo-Fewer than 5 partners  Lifestyle  . Physical activity:    Days per week: Not on file    Minutes per session: Not on file  . Stress: Not on file  Relationships  . Social connections:    Talks on phone: Not on file    Gets together: Not on file    Attends religious service: Not on file    Active member of club or organization: Not on file    Attends meetings of clubs or organizations: Not on file    Relationship status: Not on file  Other Topics Concern  . Not on file  Social History Narrative   Exercise - regularl    Family History  Problem Relation Age of Onset  . Cancer Mother        ov/ut  . Breast cancer Mother 1652  . Hypertension Mother   . Heart disease Father   . Hypertension Father   . Diabetes Brother   . Kidney disease Brother   . Breast cancer Sister        Age 630's  . Cancer Sister        ov/ut  Review of Systems  Constitutional: Negative for chills and fever.  Eyes: Negative for visual disturbance.  Respiratory: Negative for cough, shortness of breath and wheezing.   Cardiovascular: Negative for chest pain, palpitations and leg swelling.  Gastrointestinal: Negative for abdominal pain, blood in stool, constipation, diarrhea and nausea.  Genitourinary: Negative for dysuria and hematuria.  Musculoskeletal: Negative for arthralgias and back pain.  Skin: Negative for color change and rash.  Neurological: Negative for light-headedness and headaches.  Psychiatric/Behavioral: Positive for sleep disturbance (at times). Negative for dysphoric mood. The patient is nervous/anxious.        Objective:   Vitals:   08/02/18 1432  BP: 112/72  Pulse: 72  Resp: 16  Temp: 98.6 F (37 C)  SpO2: 98%   Filed Weights   08/02/18 1432  Weight: 154 lb (69.9 kg)   Body mass index is 22.74 kg/m.  Wt Readings from Last 3 Encounters:  08/02/18 154 lb (69.9 kg)  12/05/17 156 lb (70.8 kg)  07/25/17 164 lb (74.4 kg)     Physical  Exam Constitutional: She appears well-developed and well-nourished. No distress.  HENT:  Head: Normocephalic and atraumatic.  Right Ear: External ear normal. Normal ear canal and TM Left Ear: External ear normal.  Normal ear canal and TM Mouth/Throat: Oropharynx is clear and moist.  Eyes: Conjunctivae and EOM are normal.  Neck: Neck supple. No tracheal deviation present. No thyromegaly present.  No carotid bruit  Cardiovascular: Normal rate, regular rhythm and normal heart sounds.   No murmur heard.  No edema. Pulmonary/Chest: Effort normal and breath sounds normal. No respiratory distress. She has no wheezes. She has no rales.  Breast: deferred to Gyn Abdominal: Soft. She exhibits no distension. There is no tenderness.  Lymphadenopathy: She has no cervical adenopathy.  Skin: Skin is warm and dry. She is not diaphoretic.  Psychiatric: She has a normal mood and affect. Her behavior is normal.        Assessment & Plan:   Physical exam: Screening blood work  ordered Immunizations  Flu today,   Discussed shingirx, others up to date Colonoscopy  - cologuard up to date Mammogram  Up to date  Gyn   Up to date  Eye exams  Due - will schedule Exercise  Walking - trying to make it more regular Weight  Normal BMI, has lost weight Skin  No concerns Substance abuse   none  See Problem List for Assessment and Plan of chronic medical problems.   Fu in one year

## 2018-08-02 ENCOUNTER — Other Ambulatory Visit (INDEPENDENT_AMBULATORY_CARE_PROVIDER_SITE_OTHER): Payer: BLUE CROSS/BLUE SHIELD

## 2018-08-02 ENCOUNTER — Encounter: Payer: Self-pay | Admitting: Internal Medicine

## 2018-08-02 ENCOUNTER — Ambulatory Visit (INDEPENDENT_AMBULATORY_CARE_PROVIDER_SITE_OTHER): Payer: BLUE CROSS/BLUE SHIELD | Admitting: Internal Medicine

## 2018-08-02 VITALS — BP 112/72 | HR 72 | Temp 98.6°F | Resp 16 | Ht 69.0 in | Wt 154.0 lb

## 2018-08-02 DIAGNOSIS — Z0000 Encounter for general adult medical examination without abnormal findings: Secondary | ICD-10-CM

## 2018-08-02 DIAGNOSIS — E7800 Pure hypercholesterolemia, unspecified: Secondary | ICD-10-CM

## 2018-08-02 DIAGNOSIS — Z23 Encounter for immunization: Secondary | ICD-10-CM

## 2018-08-02 LAB — CBC WITH DIFFERENTIAL/PLATELET
BASOS ABS: 0.1 10*3/uL (ref 0.0–0.1)
Basophils Relative: 0.8 % (ref 0.0–3.0)
EOS ABS: 0.3 10*3/uL (ref 0.0–0.7)
EOS PCT: 4.9 % (ref 0.0–5.0)
HCT: 40.7 % (ref 36.0–46.0)
Hemoglobin: 13.8 g/dL (ref 12.0–15.0)
LYMPHS ABS: 2.4 10*3/uL (ref 0.7–4.0)
Lymphocytes Relative: 37.5 % (ref 12.0–46.0)
MCHC: 33.8 g/dL (ref 30.0–36.0)
MCV: 88.7 fl (ref 78.0–100.0)
MONO ABS: 0.5 10*3/uL (ref 0.1–1.0)
Monocytes Relative: 7.5 % (ref 3.0–12.0)
NEUTROS PCT: 49.3 % (ref 43.0–77.0)
Neutro Abs: 3.1 10*3/uL (ref 1.4–7.7)
Platelets: 230 10*3/uL (ref 150.0–400.0)
RBC: 4.59 Mil/uL (ref 3.87–5.11)
RDW: 13.5 % (ref 11.5–15.5)
WBC: 6.4 10*3/uL (ref 4.0–10.5)

## 2018-08-02 LAB — LIPID PANEL
CHOLESTEROL: 195 mg/dL (ref 0–200)
HDL: 59.7 mg/dL (ref >39.00)
LDL Cholesterol: 122 mg/dL — ABNORMAL HIGH (ref 0–99)
NONHDL: 135.25
Total CHOL/HDL Ratio: 3
Triglycerides: 65 mg/dL (ref 0.0–149.0)
VLDL: 13 mg/dL (ref 0.0–40.0)

## 2018-08-02 LAB — COMPREHENSIVE METABOLIC PANEL
ALBUMIN: 4.2 g/dL (ref 3.5–5.2)
ALT: 13 U/L (ref 0–35)
AST: 17 U/L (ref 0–37)
Alkaline Phosphatase: 59 U/L (ref 39–117)
BILIRUBIN TOTAL: 0.9 mg/dL (ref 0.2–1.2)
BUN: 16 mg/dL (ref 6–23)
CO2: 30 mEq/L (ref 19–32)
CREATININE: 0.81 mg/dL (ref 0.40–1.20)
Calcium: 9.3 mg/dL (ref 8.4–10.5)
Chloride: 107 mEq/L (ref 96–112)
GFR: 77 mL/min (ref >60.00)
Glucose, Bld: 73 mg/dL (ref 70–99)
Potassium: 3.4 mEq/L — ABNORMAL LOW (ref 3.5–5.1)
Sodium: 141 mEq/L (ref 135–145)
TOTAL PROTEIN: 6.8 g/dL (ref 6.0–8.3)

## 2018-08-02 LAB — TSH: TSH: 1.41 u[IU]/mL (ref 0.35–4.50)

## 2018-08-03 ENCOUNTER — Encounter: Payer: Self-pay | Admitting: Internal Medicine

## 2018-08-03 NOTE — Assessment & Plan Note (Signed)
Cholesterol has been a little elevated in the past - possibly genetic Has lost weight Exercising irregularly now, but will make it more regular Eating more healthy than previous Tsh, cmp, lipids

## 2018-11-13 DIAGNOSIS — S52502A Unspecified fracture of the lower end of left radius, initial encounter for closed fracture: Secondary | ICD-10-CM | POA: Diagnosis not present

## 2018-11-13 DIAGNOSIS — S63501A Unspecified sprain of right wrist, initial encounter: Secondary | ICD-10-CM | POA: Diagnosis not present

## 2018-11-27 DIAGNOSIS — M25532 Pain in left wrist: Secondary | ICD-10-CM | POA: Diagnosis not present

## 2018-11-27 DIAGNOSIS — M25531 Pain in right wrist: Secondary | ICD-10-CM | POA: Diagnosis not present

## 2019-08-12 ENCOUNTER — Encounter: Payer: Self-pay | Admitting: Gynecology

## 2019-09-10 ENCOUNTER — Encounter: Payer: BLUE CROSS/BLUE SHIELD | Admitting: Gynecology

## 2019-09-24 ENCOUNTER — Encounter: Payer: BLUE CROSS/BLUE SHIELD | Admitting: Gynecology

## 2019-09-30 ENCOUNTER — Other Ambulatory Visit: Payer: Self-pay

## 2019-10-01 ENCOUNTER — Encounter: Payer: Self-pay | Admitting: Gynecology

## 2019-10-01 ENCOUNTER — Ambulatory Visit (INDEPENDENT_AMBULATORY_CARE_PROVIDER_SITE_OTHER): Payer: BLUE CROSS/BLUE SHIELD | Admitting: Gynecology

## 2019-10-01 VITALS — BP 118/76 | Ht 69.0 in | Wt 161.0 lb

## 2019-10-01 DIAGNOSIS — N952 Postmenopausal atrophic vaginitis: Secondary | ICD-10-CM | POA: Diagnosis not present

## 2019-10-01 DIAGNOSIS — Z01419 Encounter for gynecological examination (general) (routine) without abnormal findings: Secondary | ICD-10-CM

## 2019-10-01 NOTE — Patient Instructions (Signed)
Schedule your mammogram.  Follow-up in 1 year for annual exam. 

## 2019-10-01 NOTE — Progress Notes (Signed)
    [name] 01-24-1961 [id number]        58 y.o.  [id number] for annual gynecologic exam.  Without gynecologic complaints  Past medical history,surgical history, problem list, medications, allergies, family history and social history were all reviewed and documented as reviewed in the EPIC chart.  ROS:  Performed with pertinent positives and negatives included in the history, assessment and plan.   Additional significant findings : None   Exam: Caryn Bee assistant Vitals:   10/01/19 1058  BP: 118/76  Weight: 161 lb (73 kg)  Height: 5\' 9"  (1.753 m)   Body mass index is 23.78 kg/m.  General appearance:  Normal affect, orientation and appearance. Skin: Grossly normal HEENT: Without gross lesions.  No cervical or supraclavicular adenopathy. Thyroid normal.  Lungs:  Clear without wheezing, rales or rhonchi Cardiac: RR, without RMG Abdominal:  Soft, nontender, without masses, guarding, rebound, organomegaly or hernia Breasts:  Examined lying and sitting without masses, retractions, discharge or axillary adenopathy. Pelvic:  Ext, BUS, Vagina: Normal with atrophic changes  Cervix: Normal with atrophic changes  Uterus: Anteverted, normal size, shape and contour, midline and mobile nontender   Adnexa: Without masses or tenderness    Anus and perineum: Normal   Rectovaginal: Normal sphincter tone without palpated masses or tenderness.    Assessment/Plan:  58 y.o. [id number] female for annual gynecologic exam.   1. Postmenopausal.  No significant menopausal symptoms or any vaginal bleeding. 2. Strong family history of breast cancer.  We again discussed her family history and I reoffered genetic counseling and she declined.  Mammography 2017.  I reminded her she is way overdue and she agrees to call and schedule.  Breast exam normal today. 3. Colonoscopy never.  Does Cologuard through her primary provider's office.  We will continue to follow-up with them in reference to colon  screening. 4. Pap smear 2019.  No Pap smear done today.  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  Normal Pap smears since.  Plan repeat Pap smear at 3-year interval per current screening guidelines. 5. DEXA never.  Recommend DEXA next year at age 58. 82. Health maintenance.  No routine lab work done as patient does this elsewhere.  Follow-up 1 year, sooner as needed.   Anastasio Auerbach MD, 11:27 AM 10/01/2019

## 2019-12-08 ENCOUNTER — Ambulatory Visit: Payer: BC Managed Care – PPO | Attending: Internal Medicine

## 2019-12-08 DIAGNOSIS — Z20822 Contact with and (suspected) exposure to covid-19: Secondary | ICD-10-CM | POA: Insufficient documentation

## 2019-12-09 LAB — NOVEL CORONAVIRUS, NAA: SARS-CoV-2, NAA: NOT DETECTED

## 2020-02-02 ENCOUNTER — Other Ambulatory Visit: Payer: Self-pay | Admitting: Obstetrics and Gynecology

## 2020-02-02 DIAGNOSIS — Z1231 Encounter for screening mammogram for malignant neoplasm of breast: Secondary | ICD-10-CM

## 2020-02-20 ENCOUNTER — Ambulatory Visit: Payer: Self-pay

## 2020-03-02 ENCOUNTER — Ambulatory Visit
Admission: RE | Admit: 2020-03-02 | Discharge: 2020-03-02 | Disposition: A | Discharge status: Home / Self Care | Payer: BC Managed Care – PPO | Source: Ambulatory Visit | Attending: Obstetrics and Gynecology | Admitting: Obstetrics and Gynecology

## 2020-03-02 ENCOUNTER — Other Ambulatory Visit: Payer: Self-pay

## 2020-03-02 DIAGNOSIS — Z1231 Encounter for screening mammogram for malignant neoplasm of breast: Secondary | ICD-10-CM | POA: Diagnosis not present

## 2020-03-15 NOTE — Patient Instructions (Addendum)
Blood work was ordered.    All other Health Maintenance issues reviewed.   All recommended immunizations and age-appropriate screenings are up-to-date or discussed.  No immunization administered today.   Medications reviewed and updated.  Changes include :     Your prescription(s) have been submitted to your pharmacy. Please take as directed and contact our office if you believe you are having problem(s) with the medication(s).  A referral was ordered for    Please followup in 1 year    Health Maintenance, Female Adopting a healthy lifestyle and getting preventive care are important in promoting health and wellness. Ask your health care provider about:  The right schedule for you to have regular tests and exams.  Things you can do on your own to prevent diseases and keep yourself healthy. What should I know about diet, weight, and exercise? Eat a healthy diet   Eat a diet that includes plenty of vegetables, fruits, low-fat dairy products, and lean protein.  Do not eat a lot of foods that are high in solid fats, added sugars, or sodium. Maintain a healthy weight Body mass index (BMI) is used to identify weight problems. It estimates body fat based on height and weight. Your health care provider can help determine your BMI and help you achieve or maintain a healthy weight. Get regular exercise Get regular exercise. This is one of the most important things you can do for your health. Most adults should:  Exercise for at least 150 minutes each week. The exercise should increase your heart rate and make you sweat (moderate-intensity exercise).  Do strengthening exercises at least twice a week. This is in addition to the moderate-intensity exercise.  Spend less time sitting. Even light physical activity can be beneficial. Watch cholesterol and blood lipids Have your blood tested for lipids and cholesterol at 60 years of age, then have this test every 5 years. Have your  cholesterol levels checked more often if:  Your lipid or cholesterol levels are high.  You are older than 60 years of age.  You are at high risk for heart disease. What should I know about cancer screening? Depending on your health history and family history, you may need to have cancer screening at various ages. This may include screening for:  Breast cancer.  Cervical cancer.  Colorectal cancer.  Skin cancer.  Lung cancer. What should I know about heart disease, diabetes, and high blood pressure? Blood pressure and heart disease  High blood pressure causes heart disease and increases the risk of stroke. This is more likely to develop in people who have high blood pressure readings, are of African descent, or are overweight.  Have your blood pressure checked: ? Every 3-5 years if you are 18-39 years of age. ? Every year if you are 40 years old or older. Diabetes Have regular diabetes screenings. This checks your fasting blood sugar level. Have the screening done:  Once every three years after age 40 if you are at a normal weight and have a low risk for diabetes.  More often and at a younger age if you are overweight or have a high risk for diabetes. What should I know about preventing infection? Hepatitis B If you have a higher risk for hepatitis B, you should be screened for this virus. Talk with your health care provider to find out if you are at risk for hepatitis B infection. Hepatitis C Testing is recommended for:  Everyone born from 1945 through 1965.  Anyone   with known risk factors for hepatitis C. Sexually transmitted infections (STIs)  Get screened for STIs, including gonorrhea and chlamydia, if: ? You are sexually active and are younger than 60 years of age. ? You are older than 60 years of age and your health care provider tells you that you are at risk for this type of infection. ? Your sexual activity has changed since you were last screened, and you are  at increased risk for chlamydia or gonorrhea. Ask your health care provider if you are at risk.  Ask your health care provider about whether you are at high risk for HIV. Your health care provider may recommend a prescription medicine to help prevent HIV infection. If you choose to take medicine to prevent HIV, you should first get tested for HIV. You should then be tested every 3 months for as long as you are taking the medicine. Pregnancy  If you are about to stop having your period (premenopausal) and you may become pregnant, seek counseling before you get pregnant.  Take 400 to 800 micrograms (mcg) of folic acid every day if you become pregnant.  Ask for birth control (contraception) if you want to prevent pregnancy. Osteoporosis and menopause Osteoporosis is a disease in which the bones lose minerals and strength with aging. This can result in bone fractures. If you are 65 years old or older, or if you are at risk for osteoporosis and fractures, ask your health care provider if you should:  Be screened for bone loss.  Take a calcium or vitamin D supplement to lower your risk of fractures.  Be given hormone replacement therapy (HRT) to treat symptoms of menopause. Follow these instructions at home: Lifestyle  Do not use any products that contain nicotine or tobacco, such as cigarettes, e-cigarettes, and chewing tobacco. If you need help quitting, ask your health care provider.  Do not use street drugs.  Do not share needles.  Ask your health care provider for help if you need support or information about quitting drugs. Alcohol use  Do not drink alcohol if: ? Your health care provider tells you not to drink. ? You are pregnant, may be pregnant, or are planning to become pregnant.  If you drink alcohol: ? Limit how much you use to 0-1 drink a day. ? Limit intake if you are breastfeeding.  Be aware of how much alcohol is in your drink. In the [place] one drink equals one 12 oz  bottle of beer (355 mL), one 5 oz glass of wine (148 mL), or one 1 oz glass of hard liquor (44 mL). General instructions  Schedule regular health, dental, and eye exams.  Stay current with your vaccines.  Tell your health care provider if: ? You often feel depressed. ? You have ever been abused or do not feel safe at home. Summary  Adopting a healthy lifestyle and getting preventive care are important in promoting health and wellness.  Follow your health care provider's instructions about healthy diet, exercising, and getting tested or screened for diseases.  Follow your health care provider's instructions on monitoring your cholesterol and blood pressure. This information is not intended to replace advice given to you by your health care provider. Make sure you discuss any questions you have with your health care provider. Document Revised: 10/23/2018 Document Reviewed: 10/23/2018 Elsevier Patient Education  2020 [employer]vier Inc.  

## 2020-03-15 NOTE — Progress Notes (Signed)
Subjective:    Patient ID: [name], female    DOB: [birth year], 60 y.o.   MRN: [id number]  HPI She is here for a physical exam.     Medications and allergies reviewed with patient and updated if appropriate.  Patient Active Problem List   Diagnosis Date Noted  . Muscle spasm 07/25/2017  . Lumbar radiculopathy 07/25/2017  . 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 Right hip pain 05/07/2017  . Right knee pain 11/07/2016  . Hypercholesteremia     No current outpatient medications on file prior to visit.   No current facility-administered medications on file prior to visit.    Past Medical History:  Diagnosis Date  . Cervical dysplasia   . CRP elevated   . Hypercholesteremia     Past Surgical History:  Procedure Laterality Date  . CERVICAL BIOPSY  W/ LOOP ELECTRODE EXCISION  10/04   LGSIL  . CESAREAN SECTION    . OOPHORECTOMY     ? RSO, DERMOID  . TOTAL HIP ARTHROPLASTY      Social History   Socioeconomic History  . Marital status: Married    Spouse name: Not on file  . Number of children: Not on file  . Years of education: Not on file  . Highest education level: Not on file  Occupational History  . Not on file  Tobacco Use  . Smoking status: Never Smoker  . Smokeless tobacco: Never Used  Substance and Sexual Activity  . Alcohol use: Yes    Alcohol/week: 7.0 standard drinks    Types: 7 Standard drinks or equivalent per week  . Drug use: No  . Sexual activity: Yes    Comment: 1st intercourse 59 yo-Fewer than 5 partners  Other Topics Concern  . Not on file  Social History Narrative   Exercise - regularl   Social Determinants of Health   Financial Resource Strain:   . Difficulty of Paying Living Expenses:   Food Insecurity:   . Worried About Programme researcher, broadcasting/film/video in the Last Year:   . Barista in the Last Year:   Transportation Needs:   . Freight forwarder (Medical):   Marland Kitchen Lack of Transportation (Non-Medical):   Physical Activity:     . Days of Exercise per Week:   . Minutes of Exercise per Session:   Stress:   . Feeling of Stress :   Social Connections:   . Frequency of Communication with Friends and Family:   . Frequency of Social Gatherings with Friends and Family:   . Attends Religious Services:   . Active Member of Clubs or Organizations:   . Attends Banker Meetings:   Marland Kitchen Marital Status:     Family History  Problem Relation Age of Onset  . Cancer Mother        ov/ut  . Breast cancer Mother 96  . Hypertension Mother   . Heart disease Father   . Hypertension Father   . Diabetes Brother   . Kidney disease Brother   . Breast cancer Sister        Age 65's  . Cancer Sister        ov/ut    Review of Systems     Objective:  There were no vitals filed for this visit. There were no vitals filed for this visit. There is no height or weight on file to calculate BMI.  BP Readings from Last 3 Encounters:  10/01/19 118/76  08/02/18 112/72  12/05/17 114/70    Wt Readings from Last 3 Encounters:  10/01/19 161 lb (73 kg)  08/02/18 154 lb (69.9 kg)  12/05/17 156 lb (70.8 kg)     Physical Exam Constitutional: She appears well-developed and well-nourished. No distress.  HENT:  Head: Normocephalic and atraumatic.  Right Ear: External ear normal. Normal ear canal and TM Left Ear: External ear normal.  Normal ear canal and TM Mouth/Throat: Oropharynx is clear and moist.  Eyes: Conjunctivae and EOM are normal.  Neck: Neck supple. No tracheal deviation present. No thyromegaly present.  No carotid bruit  Cardiovascular: Normal rate, regular rhythm and normal heart sounds.   No murmur heard.  No edema. Pulmonary/Chest: Effort normal and breath sounds normal. No respiratory distress. She has no wheezes. She has no rales.  Breast: deferred   Abdominal: Soft. She exhibits no distension. There is no tenderness.  Lymphadenopathy: She has no cervical adenopathy.  Skin: Skin is warm and dry. She  is not diaphoretic.  Psychiatric: She has a normal mood and affect. Her behavior is normal.        Assessment & Plan:   Physical exam: Screening blood work    ordered Immunizations  Discussed shingrix, ? Had covid Colon CA screening - cologuard due --  Discussed colonoscopy vs cologuard   Mammogram  Up to date  Gyn  Up to date  Eye exams   Exercise   Weight   Substance abuse  none  See Problem List for Assessment and Plan of chronic medical problems.     This visit occurred during the SARS-CoV-2 public health emergency.  Safety protocols were in place, including screening questions prior to the visit, additional usage of staff PPE, and extensive cleaning of exam room while observing appropriate contact time as indicated for disinfecting solutions.     This encounter was created in error - please disregard.

## 2020-03-16 ENCOUNTER — Encounter: Payer: BC Managed Care – PPO | Admitting: Internal Medicine

## 2020-03-16 DIAGNOSIS — E7800 Pure hypercholesterolemia, unspecified: Secondary | ICD-10-CM

## 2020-03-16 DIAGNOSIS — Z0000 Encounter for general adult medical examination without abnormal findings: Secondary | ICD-10-CM

## 2020-03-18 NOTE — Progress Notes (Signed)
Subjective:    Patient ID: [name], female    DOB: [birth year], 59 y.o.   MRN: [id number]  HPI She is here for a physical exam.   She denies any changes in her health since she was here last.  She has no major concerns.  Medications and allergies reviewed with patient and updated if appropriate.  Patient Active Problem List   Diagnosis Date Noted  . Preventative health care 03/19/2020  . Muscle spasm 07/25/2017  . Lumbar radiculopathy 07/25/2017  . 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 Right hip pain 05/07/2017  . Right knee pain 11/07/2016  . Hypercholesteremia     No current outpatient medications on file prior to visit.   No current facility-administered medications on file prior to visit.    Past Medical History:  Diagnosis Date  . Cervical dysplasia   . CRP elevated   . Hypercholesteremia     Past Surgical History:  Procedure Laterality Date  . CERVICAL BIOPSY  W/ LOOP ELECTRODE EXCISION  10/04   LGSIL  . CESAREAN SECTION    . OOPHORECTOMY     ? RSO, DERMOID  . TOTAL HIP ARTHROPLASTY      Social History   Socioeconomic History  . Marital status: Married    Spouse name: Not on file  . Number of children: Not on file  . Years of education: Not on file  . Highest education level: Not on file  Occupational History  . Not on file  Tobacco Use  . Smoking status: Never Smoker  . Smokeless tobacco: Never Used  Substance and Sexual Activity  . Alcohol use: Yes    Alcohol/week: 7.0 standard drinks    Types: 7 Standard drinks or equivalent per week  . Drug use: No  . Sexual activity: Yes    Comment: 1st intercourse 59 yo-Fewer than 5 partners  Other Topics Concern  . Not on file  Social History Narrative   Exercise - regularl   Social Determinants of Health   Financial Resource Strain:   . Difficulty of Paying Living Expenses:   Food Insecurity:   . Worried About Charity fundraiser in the Last Year:   . Arboriculturist in the Last Year:     Transportation Needs:   . Film/video editor (Medical):   Marland Kitchen Lack of Transportation (Non-Medical):   Physical Activity:   . Days of Exercise per Week:   . Minutes of Exercise per Session:   Stress:   . Feeling of Stress :   Social Connections:   . Frequency of Communication with Friends and Family:   . Frequency of Social Gatherings with Friends and Family:   . Attends Religious Services:   . Active Member of Clubs or Organizations:   . Attends Archivist Meetings:   Marland Kitchen Marital Status:     Family History  Problem Relation Age of Onset  . Cancer Mother        ov/ut  . Breast cancer Mother 38  . Hypertension Mother   . Heart disease Father   . Hypertension Father   . Diabetes Brother   . Kidney disease Brother   . Breast cancer Sister        Age 24's  . Cancer Sister        ov/ut    Review of Systems  Constitutional: Negative for chills and fever.  Eyes: Negative for visual disturbance.  Respiratory: Negative for cough, shortness of  breath and wheezing.   Cardiovascular: Negative for chest pain, palpitations and leg swelling.  Gastrointestinal: Positive for anal bleeding (once after severe diarrhea). Negative for abdominal pain, blood in stool, constipation, diarrhea and nausea.       No gerd  Genitourinary: Negative for dysuria and hematuria.  Musculoskeletal: Positive for arthralgias (right hip when overdoes it).  Skin: Negative for color change and rash.  Neurological: Negative for light-headedness and headaches.  Psychiatric/Behavioral: Negative for dysphoric mood. The patient is not nervous/anxious.        Objective:   Vitals:   03/19/20 0826  BP: 124/80  Pulse: 62  Resp: 16  Temp: 98.7 F (37.1 C)  SpO2: 99%   Filed Weights   03/19/20 0826  Weight: 165 lb (74.8 kg)   Body mass index is 24.37 kg/m.  BP Readings from Last 3 Encounters:  03/19/20 124/80  10/01/19 118/76  08/02/18 112/72    Wt Readings from Last 3 Encounters:   03/19/20 165 lb (74.8 kg)  10/01/19 161 lb (73 kg)  08/02/18 154 lb (69.9 kg)     Physical Exam Constitutional: She appears well-developed and well-nourished. No distress.  HENT:  Head: Normocephalic and atraumatic.  Right Ear: External ear normal. Normal ear canal and TM Left Ear: External ear normal.  Normal ear canal and TM Mouth/Throat: Oropharynx is clear and moist.  Eyes: Conjunctivae and EOM are normal.  Neck: Neck supple. No tracheal deviation present. No thyromegaly present.  No carotid bruit  Cardiovascular: Normal rate, regular rhythm and normal heart sounds.   No murmur heard.  No edema. Pulmonary/Chest: Effort normal and breath sounds normal. No respiratory distress. She has no wheezes. She has no rales.  Breast: deferred   Abdominal: Soft. She exhibits no distension. There is no tenderness.  Lymphadenopathy: She has no cervical adenopathy.  Skin: Skin is warm and dry. She is not diaphoretic.  Psychiatric: She has a normal mood and affect. Her behavior is normal.        Assessment & Plan:   Physical exam: Screening blood work    ordered Immunizations  Discussed shingrix,  Had covid Colon CA screening - cologuard due --  Discussed colonoscopy vs cologuard   Mammogram  Up to date  Gyn  Up to date  Eye exams  Up to date  Exercise  Regular - elliptical 1 hr every day Weight  Normal BMI Substance abuse  none  See Problem List for Assessment and Plan of chronic medical problems.     This visit occurred during the SARS-CoV-2 public health emergency.  Safety protocols were in place, including screening questions prior to the visit, additional usage of staff PPE, and extensive cleaning of exam room while observing appropriate contact time as indicated for disinfecting solutions.

## 2020-03-18 NOTE — Patient Instructions (Addendum)
Blood work was ordered.    All other Health Maintenance issues reviewed.   All recommended immunizations and age-appropriate screenings are up-to-date or discussed.  No immunization administered today.   Medications reviewed and updated.  Changes include :   none     Please followup in 1 year    Health Maintenance, Female Adopting a healthy lifestyle and getting preventive care are important in promoting health and wellness. Ask your health care provider about:  The right schedule for you to have regular tests and exams.  Things you can do on your own to prevent diseases and keep yourself healthy. What should I know about diet, weight, and exercise? Eat a healthy diet   Eat a diet that includes plenty of vegetables, fruits, low-fat dairy products, and lean protein.  Do not eat a lot of foods that are high in solid fats, added sugars, or sodium. Maintain a healthy weight Body mass index (BMI) is used to identify weight problems. It estimates body fat based on height and weight. Your health care provider can help determine your BMI and help you achieve or maintain a healthy weight. Get regular exercise Get regular exercise. This is one of the most important things you can do for your health. Most adults should:  Exercise for at least 150 minutes each week. The exercise should increase your heart rate and make you sweat (moderate-intensity exercise).  Do strengthening exercises at least twice a week. This is in addition to the moderate-intensity exercise.  Spend less time sitting. Even light physical activity can be beneficial. Watch cholesterol and blood lipids Have your blood tested for lipids and cholesterol at 60 years of age, then have this test every 5 years. Have your cholesterol levels checked more often if:  Your lipid or cholesterol levels are high.  You are older than 60 years of age.  You are at high risk for heart disease. What should I know about cancer  screening? Depending on your health history and family history, you may need to have cancer screening at various ages. This may include screening for:  Breast cancer.  Cervical cancer.  Colorectal cancer.  Skin cancer.  Lung cancer. What should I know about heart disease, diabetes, and high blood pressure? Blood pressure and heart disease  High blood pressure causes heart disease and increases the risk of stroke. This is more likely to develop in people who have high blood pressure readings, are of African descent, or are overweight.  Have your blood pressure checked: ? Every 3-5 years if you are 18-39 years of age. ? Every year if you are 40 years old or older. Diabetes Have regular diabetes screenings. This checks your fasting blood sugar level. Have the screening done:  Once every three years after age 40 if you are at a normal weight and have a low risk for diabetes.  More often and at a younger age if you are overweight or have a high risk for diabetes. What should I know about preventing infection? Hepatitis B If you have a higher risk for hepatitis B, you should be screened for this virus. Talk with your health care provider to find out if you are at risk for hepatitis B infection. Hepatitis C Testing is recommended for:  Everyone born from 1945 through 1965.  Anyone with known risk factors for hepatitis C. Sexually transmitted infections (STIs)  Get screened for STIs, including gonorrhea and chlamydia, if: ? You are sexually active and are younger than 60 years   of age. ? You are older than 60 years of age and your health care provider tells you that you are at risk for this type of infection. ? Your sexual activity has changed since you were last screened, and you are at increased risk for chlamydia or gonorrhea. Ask your health care provider if you are at risk.  Ask your health care provider about whether you are at high risk for HIV. Your health care provider may  recommend a prescription medicine to help prevent HIV infection. If you choose to take medicine to prevent HIV, you should first get tested for HIV. You should then be tested every 3 months for as long as you are taking the medicine. Pregnancy  If you are about to stop having your period (premenopausal) and you may become pregnant, seek counseling before you get pregnant.  Take 400 to 800 micrograms (mcg) of folic acid every day if you become pregnant.  Ask for birth control (contraception) if you want to prevent pregnancy. Osteoporosis and menopause Osteoporosis is a disease in which the bones lose minerals and strength with aging. This can result in bone fractures. If you are 65 years old or older, or if you are at risk for osteoporosis and fractures, ask your health care provider if you should:  Be screened for bone loss.  Take a calcium or vitamin D supplement to lower your risk of fractures.  Be given hormone replacement therapy (HRT) to treat symptoms of menopause. Follow these instructions at home: Lifestyle  Do not use any products that contain nicotine or tobacco, such as cigarettes, e-cigarettes, and chewing tobacco. If you need help quitting, ask your health care provider.  Do not use street drugs.  Do not share needles.  Ask your health care provider for help if you need support or information about quitting drugs. Alcohol use  Do not drink alcohol if: ? Your health care provider tells you not to drink. ? You are pregnant, may be pregnant, or are planning to become pregnant.  If you drink alcohol: ? Limit how much you use to 0-1 drink a day. ? Limit intake if you are breastfeeding.  Be aware of how much alcohol is in your drink. In the [place] one drink equals one 12 oz bottle of beer (355 mL), one 5 oz glass of wine (148 mL), or one 1 oz glass of hard liquor (44 mL). General instructions  Schedule regular health, dental, and eye exams.  Stay current with your  vaccines.  Tell your health care provider if: ? You often feel depressed. ? You have ever been abused or do not feel safe at home. Summary  Adopting a healthy lifestyle and getting preventive care are important in promoting health and wellness.  Follow your health care provider's instructions about healthy diet, exercising, and getting tested or screened for diseases.  Follow your health care provider's instructions on monitoring your cholesterol and blood pressure. This information is not intended to replace advice given to you by your health care provider. Make sure you discuss any questions you have with your health care provider. Document Revised: 10/23/2018 Document Reviewed: 10/23/2018 Elsevier Patient Education  2020 [employer] Inc.  

## 2020-03-19 ENCOUNTER — Encounter: Payer: Self-pay | Admitting: Internal Medicine

## 2020-03-19 ENCOUNTER — Other Ambulatory Visit: Payer: Self-pay

## 2020-03-19 ENCOUNTER — Ambulatory Visit (INDEPENDENT_AMBULATORY_CARE_PROVIDER_SITE_OTHER): Payer: BC Managed Care – PPO | Admitting: Internal Medicine

## 2020-03-19 VITALS — BP 124/80 | HR 62 | Temp 98.7°F | Resp 16 | Ht 69.0 in | Wt 165.0 lb

## 2020-03-19 DIAGNOSIS — E7800 Pure hypercholesterolemia, unspecified: Secondary | ICD-10-CM

## 2020-03-19 DIAGNOSIS — Z0000 Encounter for general adult medical examination without abnormal findings: Secondary | ICD-10-CM | POA: Diagnosis not present

## 2020-03-19 DIAGNOSIS — Z96641 Presence of right artificial hip joint: Secondary | ICD-10-CM | POA: Diagnosis not present

## 2020-03-19 LAB — COMPREHENSIVE METABOLIC PANEL
ALT: 15 U/L (ref 0–35)
AST: 20 U/L (ref 0–37)
Albumin: 4.4 g/dL (ref 3.5–5.2)
Alkaline Phosphatase: 67 U/L (ref 39–117)
BUN: 14 mg/dL (ref 6–23)
CO2: 31 mEq/L (ref 19–32)
Calcium: 9.9 mg/dL (ref 8.4–10.5)
Chloride: 104 mEq/L (ref 96–112)
Creatinine, Ser: 0.85 mg/dL (ref 0.40–1.20)
GFR: 68.15 mL/min (ref >60.00)
Glucose, Bld: 93 mg/dL (ref 70–99)
Potassium: 3.8 mEq/L (ref 3.5–5.1)
Sodium: 139 mEq/L (ref 135–145)
Total Bilirubin: 0.9 mg/dL (ref 0.2–1.2)
Total Protein: 7.3 g/dL (ref 6.0–8.3)

## 2020-03-19 LAB — CBC WITH DIFFERENTIAL/PLATELET
Basophils Absolute: 0 10*3/uL (ref 0.0–0.1)
Basophils Relative: 0.8 % (ref 0.0–3.0)
Eosinophils Absolute: 0.2 10*3/uL (ref 0.0–0.7)
Eosinophils Relative: 4.3 % (ref 0.0–5.0)
HCT: 42.9 % (ref 36.0–46.0)
Hemoglobin: 14.5 g/dL (ref 12.0–15.0)
Lymphocytes Relative: 42.3 % (ref 12.0–46.0)
Lymphs Abs: 2.3 10*3/uL (ref 0.7–4.0)
MCHC: 33.9 g/dL (ref 30.0–36.0)
MCV: 91.9 fl (ref 78.0–100.0)
Monocytes Absolute: 0.5 10*3/uL (ref 0.1–1.0)
Monocytes Relative: 9.7 % (ref 3.0–12.0)
Neutro Abs: 2.3 10*3/uL (ref 1.4–7.7)
Neutrophils Relative %: 42.9 % — ABNORMAL LOW (ref 43.0–77.0)
Platelets: 240 10*3/uL (ref 150.0–400.0)
RBC: 4.67 Mil/uL (ref 3.87–5.11)
RDW: 13.2 % (ref 11.5–15.5)
WBC: 5.5 10*3/uL (ref 4.0–10.5)

## 2020-03-19 LAB — LIPID PANEL
Cholesterol: 235 mg/dL — ABNORMAL HIGH (ref 0–200)
HDL: 60.4 mg/dL (ref >39.00)
LDL Cholesterol: 156 mg/dL — ABNORMAL HIGH (ref 0–99)
NonHDL: 175.05
Total CHOL/HDL Ratio: 4
Triglycerides: 97 mg/dL (ref 0.0–149.0)
VLDL: 19.4 mg/dL (ref 0.0–40.0)

## 2020-03-19 LAB — TSH: TSH: 1.64 u[IU]/mL (ref 0.35–4.50)

## 2020-03-19 NOTE — Assessment & Plan Note (Signed)
Chronic Check lipid panel  Lifestyle controlled Regular exercise and healthy diet encouraged  

## 2020-03-19 NOTE — Assessment & Plan Note (Signed)
Had surgery in 2018 Has pain if she overdoes it only

## 2020-03-20 ENCOUNTER — Encounter: Payer: Self-pay | Admitting: Internal Medicine

## 2020-04-19 NOTE — Progress Notes (Deleted)
Virtual Visit via Video Note  I connected with [name] on 04/19/20 at  3:15 PM EDT by a video enabled telemedicine application and verified that I am speaking with the correct person using two identifiers.   I discussed the limitations of evaluation and management by telemedicine and the availability of in person appointments. The patient expressed understanding and agreed to proceed.  Present for the visit:  Myself, Dr Cheryll Cockayne, Brandon Melnick.  The patient is currently at home and I am in the office.    No referring provider.    History of Present Illness: This is an acute visit for dairrhea, stomach pain, dry mouth.    ROS    Social History   Socioeconomic History  . Marital status: Married    Spouse name: Not on file  . Number of children: Not on file  . Years of education: Not on file  . Highest education level: Not on file  Occupational History  . Not on file  Tobacco Use  . Smoking status: Never Smoker  . Smokeless tobacco: Never Used  Substance and Sexual Activity  . Alcohol use: Yes    Alcohol/week: 7.0 standard drinks    Types: 7 Standard drinks or equivalent per week  . Drug use: No  . Sexual activity: Yes    Comment: 1st intercourse 59 yo-Fewer than 5 partners  Other Topics Concern  . Not on file  Social History Narrative   Exercise - regularl   Social Determinants of Health   Financial Resource Strain:   . Difficulty of Paying Living Expenses:   Food Insecurity:   . Worried About Programme researcher, broadcasting/film/video in the Last Year:   . Barista in the Last Year:   Transportation Needs:   . Freight forwarder (Medical):   Marland Kitchen Lack of Transportation (Non-Medical):   Physical Activity:   . Days of Exercise per Week:   . Minutes of Exercise per Session:   Stress:   . Feeling of Stress :   Social Connections:   . Frequency of Communication with Friends and Family:   . Frequency of Social Gatherings with Friends and Family:   . Attends Religious  Services:   . Active Member of Clubs or Organizations:   . Attends Banker Meetings:   Marland Kitchen Marital Status:      Observations/Objective: Appears well in NAD   Assessment and Plan:  See Problem List for Assessment and Plan of chronic medical problems.   Follow Up Instructions:    I discussed the assessment and treatment plan with the patient. The patient was provided an opportunity to ask questions and all were answered. The patient agreed with the plan and demonstrated an understanding of the instructions.   The patient was advised to call back or seek an in-person evaluation if the symptoms worsen or if the condition fails to improve as anticipated.    Pincus Sanes, MD

## 2020-04-20 ENCOUNTER — Telehealth: Payer: BC Managed Care – PPO | Admitting: Internal Medicine

## 2020-04-21 ENCOUNTER — Other Ambulatory Visit: Payer: Self-pay

## 2020-04-21 ENCOUNTER — Encounter: Payer: Self-pay | Admitting: Internal Medicine

## 2020-04-21 ENCOUNTER — Ambulatory Visit (INDEPENDENT_AMBULATORY_CARE_PROVIDER_SITE_OTHER): Payer: BC Managed Care – PPO | Admitting: Internal Medicine

## 2020-04-21 DIAGNOSIS — B370 Candidal stomatitis: Secondary | ICD-10-CM | POA: Diagnosis not present

## 2020-04-21 DIAGNOSIS — A084 Viral intestinal infection, unspecified: Secondary | ICD-10-CM | POA: Diagnosis not present

## 2020-04-21 MED ORDER — FLUCONAZOLE 100 MG PO TABS
100.0000 mg | ORAL_TABLET | Freq: Every day | ORAL | 0 refills | Status: DC
Start: 2020-04-21 — End: 2020-06-21

## 2020-04-21 NOTE — Progress Notes (Signed)
Subjective:    Patient ID: [name], female    DOB: 12/21/1960, 59 y.o.   MRN: [id number]  HPI The patient is here for an acute visit.   Two weeks ago she started to experience diarrhea some abdominal pain and bloating.  2 days ago the diarrhea stopped.  She has not had a bowel movement since then, but has not been eating much.  She still has some lower abdominal discomfort, but no pain.  She did not have any fevers or chills or other cold symptoms with this.  There is no blood in the stool, nausea or vomiting.  She denies any sick contacts.  A few days ago she surgery experience a very dry mouth.  Her tongue, mouth and inner parts of her lips felt like they had been burned.  She felt like she had white stuff on her tongue and bumps.  The burning sensation and dry mouth has persisted despite increasing her fluids.  Her appetite is low and she has not been eating much, but she is drinking plenty of fluids.  Over the past couple weeks it has been increased stress.   Medications and allergies reviewed with patient and updated if appropriate.  Patient Active Problem List   Diagnosis Date Noted  . History of right hip replacement, 2018 03/19/2020  . Hypercholesteremia     No current outpatient medications on file prior to visit.   No current facility-administered medications on file prior to visit.    Past Medical History:  Diagnosis Date  . Cervical dysplasia   . CRP elevated   . Hypercholesteremia     Past Surgical History:  Procedure Laterality Date  . CERVICAL BIOPSY  W/ LOOP ELECTRODE EXCISION  10/04   LGSIL  . CESAREAN SECTION    . OOPHORECTOMY     ? RSO, DERMOID  . TOTAL HIP ARTHROPLASTY      Social History   Socioeconomic History  . Marital status: Married    Spouse name: Not on file  . Number of children: Not on file  . Years of education: Not on file  . Highest education level: Not on file  Occupational History  . Not on file  Tobacco Use  .  Smoking status: Never Smoker  . Smokeless tobacco: Never Used  Substance and Sexual Activity  . Alcohol use: Yes    Alcohol/week: 7.0 standard drinks    Types: 7 Standard drinks or equivalent per week  . Drug use: No  . Sexual activity: Yes    Comment: 1st intercourse 59 yo-Fewer than 5 partners  Other Topics Concern  . Not on file  Social History Narrative   Exercise - regularl   Social Determinants of Health   Financial Resource Strain:   . Difficulty of Paying Living Expenses:   Food Insecurity:   . Worried About Charity fundraiser in the Last Year:   . Arboriculturist in the Last Year:   Transportation Needs:   . Film/video editor (Medical):   Marland Kitchen Lack of Transportation (Non-Medical):   Physical Activity:   . Days of Exercise per Week:   . Minutes of Exercise per Session:   Stress:   . Feeling of Stress :   Social Connections:   . Frequency of Communication with Friends and Family:   . Frequency of Social Gatherings with Friends and Family:   . Attends Religious Services:   . Active Member of Clubs or Organizations:   .  Attends Banker Meetings:   Marland Kitchen Marital Status:     Family History  Problem Relation Age of Onset  . Cancer Mother        ov/ut  . Breast cancer Mother 58  . Hypertension Mother   . Heart disease Father   . Hypertension Father   . Diabetes Brother   . Kidney disease Brother   . Breast cancer Sister        Age 41's  . Cancer Sister        ov/ut    Review of Systems  Constitutional: Positive for diaphoresis (once three days ago). Negative for appetite change, chills and fever.  Gastrointestinal: Positive for abdominal pain (occ, better) and diarrhea (resolved). Negative for blood in stool, nausea and vomiting.  Neurological: Positive for headaches (once - three days ago). Negative for dizziness and light-headedness.       Objective:   Vitals:   04/21/20 1519  BP: 114/72  Pulse: 80  Temp: 98.1 F (36.7 C)  SpO2: 98%     BP Readings from Last 3 Encounters:  04/21/20 114/72  03/19/20 124/80  10/01/19 118/76   Wt Readings from Last 3 Encounters:  04/21/20 161 lb (73 kg)  03/19/20 165 lb (74.8 kg)  10/01/19 161 lb (73 kg)   Body mass index is 23.78 kg/m.   Physical Exam Constitutional:      General: She is not in acute distress.    Appearance: Normal appearance. She is not ill-appearing.  HENT:     Head: Normocephalic and atraumatic.     Mouth/Throat:     Pharynx: No oropharyngeal exudate.     Comments: Slight white coating on tongue and beefy red appearance of lips, no ulcers or oral lesions Abdominal:     General: There is no distension.     Palpations: Abdomen is soft. There is no mass.     Tenderness: There is abdominal tenderness (Mild across lower abdomen). There is no guarding or rebound.  Skin:    General: Skin is warm and dry.  Neurological:     Mental Status: She is alert.            Assessment & Plan:    See Problem List for Assessment and Plan of chronic medical problems.    This visit occurred during the SARS-CoV-2 public health emergency.  Safety protocols were in place, including screening questions prior to the visit, additional usage of staff PPE, and extensive cleaning of exam room while observing appropriate contact time as indicated for disinfecting solutions.

## 2020-04-21 NOTE — Patient Instructions (Addendum)
Diflucan daily for one week.       Oral Thrush, Adult  Oral thrush, also called oral candidiasis, is a fungal infection that develops in the mouth and throat and on the tongue. It causes white patches to form on the mouth and tongue. [name] is most common in older adults, but it can occur at any age. Many cases of thrush are mild, but this infection can also be serious. [name] can be a repeated (recurrent) problem for certain people who have a weak body defense system (immune system). The weakness can be caused by chronic illnesses, or by taking medicines that limit the body's ability to fight infection. If a person has difficulty fighting infection, the fungus that causes thrush can spread through the body. This can cause life-threatening blood or organ infections. What are the causes? This condition is caused by a fungus (yeast) called Candida albicans.  This fungus is normally present in small amounts in the mouth and on other mucous membranes. It usually causes no harm.  If conditions are present that allow the fungus to grow without control, it invades surrounding tissues and becomes an infection.  Other Candida species can also lead to thrush (rare). What increases the risk? This condition is more likely to develop in:  People with a weakened immune system.  Older adults.  People with HIV (human immunodeficiency virus).  People with diabetes.  People with dry mouth (xerostomia).  Pregnant women.  People with poor dental care, especially people who have false teeth.  People who use antibiotic medicines. What are the signs or symptoms? Symptoms of this condition can vary from mild and moderate to severe and persistent. Symptoms may include:  A burning feeling in the mouth and throat. This can occur at the start of a thrush infection.  White patches that stick to the mouth and tongue. The tissue around the patches may be red, raw, and painful. If rubbed (during tooth  brushing, for example), the patches and the tissue of the mouth may bleed easily.  A bad taste in the mouth or difficulty tasting foods.  A cottony feeling in the mouth.  Pain during eating and swallowing.  Poor appetite.  Cracking at the corners of the mouth. How is this diagnosed? This condition is diagnosed based on:  Physical exam. Your health care provider will look in your mouth.  Health history. Your health care provider will ask you questions about your health. How is this treated? This condition is treated with medicines called antifungals, which prevent the growth of fungi. These medicines are either applied directly to the affected area (topical) or swallowed (oral). The treatment will depend on the severity of the condition. Mild thrush Mild cases of thrush may clear up with the use of an antifungal mouth rinse or lozenges. Treatment usually lasts about 14 days. Moderate to severe thrush  More severe thrush infections that have spread to the esophagus are treated with an oral antifungal medicine. A topical antifungal medicine may also be used.  For some severe infections, treatment may need to continue for more than 14 days.  Oral antifungal medicines are rarely used during pregnancy because they may be harmful to the unborn child. If you are pregnant, talk with your health care provider about options for treatment. Persistent or recurrent thrush For cases of thrush that do not go away or keep coming back:  Treatment may be needed twice as long as the symptoms last.  Treatment will include both oral and topical  antifungal medicines.  People with a weakened immune system can take an antifungal medicine on a continuous basis to prevent thrush infections. It is important to treat conditions that make a person more likely to get thrush, such as diabetes or HIV. Follow these instructions at home: Medicines  Take over-the-counter and prescription medicines only as told  by your health care provider.  Talk with your health care provider about an over-the-counter medicine called gentian violet, which kills bacteria and fungi. Relieving soreness and discomfort To help reduce the discomfort of thrush:  Drink cold liquids such as water or iced tea.  Try flavored ice treats or frozen juices.  Eat foods that are easy to swallow, such as gelatin, ice cream, or custard.  Try drinking from a straw if the patches in your mouth are painful.  General instructions  Eat plain, unflavored yogurt as directed by your health care provider. Check the label to make sure the yogurt contains live cultures. This yogurt can help healthy bacteria to grow in the mouth and can stop the growth of the fungus that causes thrush.  If you wear dentures, remove the dentures before going to bed, brush them vigorously, and soak them in a cleaning solution as directed by your health care provider.  Rinse your mouth with a warm salt-water mixture several times a day. To make a salt-water mixture, completely dissolve 1/2-1 tsp of salt in 1 cup of warm water. Contact a health care provider if:  Your symptoms are getting worse or are not improving within 7 days of starting treatment.  You have symptoms of a spreading infection, such as white patches on the skin outside of the mouth. This information is not intended to replace advice given to you by your health care provider. Make sure you discuss any questions you have with your health care provider. Document Revised: 02/01/2018 Document Reviewed: 07/24/2016 Elsevier Patient Education  [place].

## 2020-04-21 NOTE — Assessment & Plan Note (Signed)
Acute Her 2 weeks of diarrhea, abdominal pain and bloating likely related to viral gastroenteritis Symptoms have improved with supportive measures Continue increased fluids and bland diet as tolerated No further evaluation necessary at this time

## 2020-04-21 NOTE — Assessment & Plan Note (Addendum)
Acute Symptoms concerning for thrush Likely secondary to acute viral gastritis and decreased immune system secondary to increased stress Diflucan 100 mg daily x 7 days Continue increased fluids Call if no improvement

## 2020-05-06 DIAGNOSIS — Z1212 Encounter for screening for malignant neoplasm of rectum: Secondary | ICD-10-CM | POA: Diagnosis not present

## 2020-05-06 DIAGNOSIS — Z1211 Encounter for screening for malignant neoplasm of colon: Secondary | ICD-10-CM | POA: Diagnosis not present

## 2020-05-11 LAB — COLOGUARD: Cologuard: NEGATIVE

## 2020-05-19 ENCOUNTER — Encounter: Payer: Self-pay | Admitting: Internal Medicine

## 2020-06-21 ENCOUNTER — Telehealth (INDEPENDENT_AMBULATORY_CARE_PROVIDER_SITE_OTHER): Payer: BC Managed Care – PPO | Admitting: Family

## 2020-06-21 DIAGNOSIS — J019 Acute sinusitis, unspecified: Secondary | ICD-10-CM | POA: Diagnosis not present

## 2020-06-21 DIAGNOSIS — Z20822 Contact with and (suspected) exposure to covid-19: Secondary | ICD-10-CM | POA: Diagnosis not present

## 2020-06-21 MED ORDER — PREDNISONE 20 MG PO TABS
20.0000 mg | ORAL_TABLET | Freq: Every day | ORAL | 0 refills | Status: DC
Start: 2020-06-21 — End: 2020-12-17

## 2020-06-21 MED ORDER — AMOXICILLIN-POT CLAVULANATE 875-125 MG PO TABS: 1.0000 | ORAL_TABLET | Freq: Two times a day (BID) | ORAL | 0 refills | Status: AC

## 2020-06-21 NOTE — Progress Notes (Signed)
[name] is a 59 y.o. female with the following history as recorded in [facility]:  Patient Active Problem List   Diagnosis Date Noted   Thrush, oral 04/21/2020   Viral gastroenteritis 04/21/2020   History of right hip replacement, 2018 03/19/2020   Hypercholesteremia     Current Outpatient Medications  Medication Sig Dispense Refill   amoxicillin-clavulanate (AUGMENTIN) 875-125 MG tablet Take 1 tablet by mouth 2 (two) times daily for 10 days. 20 tablet 0   predniSONE (DELTASONE) 20 MG tablet Take 1 tablet (20 mg total) by mouth daily with breakfast. 5 tablet 0   No current facility-administered medications for this visit.    Allergies: Patient has no known allergies.  Past Medical History:  Diagnosis Date   Cervical dysplasia    CRP elevated    Hypercholesteremia     Past Surgical History:  Procedure Laterality Date   CERVICAL BIOPSY  W/ LOOP ELECTRODE EXCISION  10/04   LGSIL   CESAREAN SECTION     OOPHORECTOMY     ? RSO, DERMOID   TOTAL HIP ARTHROPLASTY      Family History  Problem Relation Age of Onset   Cancer Mother        ov/ut   Breast cancer Mother 52   Hypertension Mother    Heart disease Father    Hypertension Father    Diabetes Brother    Kidney disease Brother    Breast cancer Sister        Age 72's   Cancer Sister        ov/ut    Social History   Tobacco Use   Smoking status: Never Smoker   Smokeless tobacco: Never Used  Substance Use Topics   Alcohol use: Yes    Alcohol/week: 7.0 standard drinks    Types: 7 Standard drinks or equivalent per week    Subjective:   I connected with [name] on 06/21/20 at  1:40 PM EDT by a video enabled telemedicine application and verified that I am speaking with the correct person using two identifiers.   I discussed the limitations of evaluation and management by telemedicine and the availability of in person appointments. The patient expressed understanding and agreed to  proceed. Provider in office/ patient is at home; provider and patient are only 2 people on video call.   Concern for sinus infection; symptoms x 1 1/2 week; + sinus pain/ pressure; did get COVID testing today to be safe but has no concerns for COVID exposure; was with grandchildren who had cold symptoms prior to onset of symptoms.     Objective:  There were no vitals filed for this visit.  General: Well developed, well nourished, in no acute distress  Head: Normocephalic and atraumatic  Lungs: Respirations unlabored;  Neurologic: Alert and oriented; speech intact;   Assessment:  1. Acute sinusitis, recurrence not specified, unspecified location     Plan:  Rx for Augmentin 875 mg bid x 10 days, Prednisone 20 mg qd x 5 days; increase fluids, rest and follow-up worse, no better.   No follow-ups on file.  No orders of the defined types were placed in this encounter.   Requested Prescriptions   Signed Prescriptions Disp Refills   amoxicillin-clavulanate (AUGMENTIN) 875-125 MG tablet 20 tablet 0    Sig: Take 1 tablet by mouth 2 (two) times daily for 10 days.   predniSONE (DELTASONE) 20 MG tablet 5 tablet 0    Sig: Take 1 tablet (20 mg total)  by mouth daily with breakfast.

## 2020-12-14 NOTE — Progress Notes (Signed)
60 y.o. G91P2012 Married White or Caucasian female here for annual exam.   Has twin 18-year old grandchildren, and a 66 month old, has been busy with their care. Works full time for [employer].  She feels good today, no concerns. Stays active  Strong family history of breast cancer Has some trouble with vaginal dryness but uses lubricant during sex and is helpful Denies vaginal bleeding    Patient's last menstrual period was 08/13/2012.          Sexually active: Yes.    The current method of family planning is post menopausal status.    Exercising: Yes.    walking, eliptical Smoker:  no  Health Maintenance: Pap:  12-05-17 neg (HPV not done) History of abnormal Pap:  Hx of LEEP 2004 MMG:  03-02-2020 category c density birads 1:neg Colonoscopy:  cologuard neg 2021 BMD:   none TDaP:  2017 Gardasil:   n/a Covid-19: moderna, pfizer booster Hep C testing: neg 2017    reports that she has never smoked. She has never used smokeless tobacco. She reports current alcohol use of about 7.0 standard drinks of alcohol per week. She reports that she does not use drugs.  Past Medical History:  Diagnosis Date  . Cervical dysplasia   . CRP elevated   . Hypercholesteremia     Past Surgical History:  Procedure Laterality Date  . CERVICAL BIOPSY  W/ LOOP ELECTRODE EXCISION  10/04   LGSIL  . CESAREAN SECTION    . OOPHORECTOMY     ? RSO, DERMOID  . TOTAL HIP ARTHROPLASTY      No current outpatient medications on file.   No current facility-administered medications for this visit.    Family History  Problem Relation Age of Onset  . Cancer Mother        ov/ut  . Breast cancer Mother 30  . Hypertension Mother   . Heart disease Father   . Hypertension Father   . Diabetes Brother   . Kidney disease Brother   . Breast cancer Sister        Age 52's  . Cancer Sister        ov/ut    Review of Systems  Constitutional: Negative.   HENT: Negative.   Eyes: Negative.   Respiratory:  Negative.   Cardiovascular: Negative.   Gastrointestinal: Negative.   Endocrine: Negative.   Genitourinary: Negative.   Musculoskeletal: Negative.   Skin: Negative.   Allergic/Immunologic: Negative.   Neurological: Negative.   Hematological: Negative.   Psychiatric/Behavioral: Negative.     Exam:   BP 108/68   Pulse 68   Resp 16   Ht 5' 8.75" (1.746 m)   Wt 161 lb (73 kg)   LMP 08/13/2012   BMI 23.95 kg/m   Height: 5' 8.75" (174.6 cm)  General appearance: alert, cooperative and appears stated age, no acute distress Head: Normocephalic, without obvious abnormality Neck: no adenopathy, thyroid normal to inspection and palpation Lungs: clear to auscultation bilaterally Breasts: normal appearance, no masses or tenderness, Normal to palpation without dominant masses Heart: regular rate and rhythm Abdomen: soft, non-tender; no masses,  no organomegaly Extremities: extremities normal, no edema Skin: No rashes or lesions Lymph nodes: Cervical, supraclavicular, and axillary nodes normal. No abnormal inguinal nodes palpated Neurologic: Grossly normal   Pelvic: External genitalia:  Hypopigmentation between labia majora and labia minora              Urethra:  normal appearing urethra with no masses, tenderness  or lesions              Bartholins and Skenes: normal                 Vagina: normal appearing vagina, appropriate for age, normal appearing discharge, no lesions              Cervix: neg cervical motion tenderness, no visible lesions             Bimanual Exam:   Uterus:  normal size, contour, position, consistency, mobility, non-tender              Adnexa: no mass, fullness, tenderness              Rectal: no palpable mass   Joy, CMA Chaperone was present for exam.  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

## 2020-12-17 ENCOUNTER — Ambulatory Visit (INDEPENDENT_AMBULATORY_CARE_PROVIDER_SITE_OTHER): Payer: BC Managed Care – PPO | Admitting: Nurse Practitioner

## 2020-12-17 ENCOUNTER — Encounter: Payer: Self-pay | Admitting: Nurse Practitioner

## 2020-12-17 ENCOUNTER — Other Ambulatory Visit (HOSPITAL_COMMUNITY)
Admission: RE | Admit: 2020-12-17 | Discharge: 2020-12-17 | Disposition: A | Discharge status: Home / Self Care | Payer: BC Managed Care – PPO | Source: Ambulatory Visit | Attending: Nurse Practitioner | Admitting: Nurse Practitioner

## 2020-12-17 ENCOUNTER — Other Ambulatory Visit: Payer: Self-pay

## 2020-12-17 VITALS — BP 108/68 | HR 68 | Resp 16 | Ht 68.75 in | Wt 161.0 lb

## 2020-12-17 DIAGNOSIS — L819 Disorder of pigmentation, unspecified: Secondary | ICD-10-CM | POA: Diagnosis not present

## 2020-12-17 DIAGNOSIS — Z01419 Encounter for gynecological examination (general) (routine) without abnormal findings: Secondary | ICD-10-CM

## 2020-12-17 NOTE — Patient Instructions (Signed)
Recommend biopsy of vulva, suspect lichen sclerosis  Health Maintenance for Postmenopausal Women Menopause is a normal process in which your ability to get pregnant comes to an end. This process happens slowly over many months or years, usually between the ages of 8 and 8. Menopause is complete when you have missed your menstrual periods for 12 months. It is important to talk with your health care provider about some of the most common conditions that affect women after menopause (postmenopausal women). These include heart disease, cancer, and bone loss (osteoporosis). Adopting a healthy lifestyle and getting preventive care can help to promote your health and wellness. The actions you take can also lower your chances of developing some of these common conditions. What should I know about menopause? During menopause, you may get a number of symptoms, such as:  Hot flashes. These can be moderate or severe.  Night sweats.  Decrease in sex drive.  Mood swings.  Headaches.  Tiredness.  Irritability.  Memory problems.  Insomnia. Choosing to treat or not to treat these symptoms is a decision that you make with your health care provider. Do I need hormone replacement therapy?  Hormone replacement therapy is effective in treating symptoms that are caused by menopause, such as hot flashes and night sweats.  Hormone replacement carries certain risks, especially as you become older. If you are thinking about using estrogen or estrogen with progestin, discuss the benefits and risks with your health care provider. What is my risk for heart disease and stroke? The risk of heart disease, heart attack, and stroke increases as you age. One of the causes may be a change in the body's hormones during menopause. This can affect how your body uses dietary fats, triglycerides, and cholesterol. Heart attack and stroke are medical emergencies. There are many things that you can do to help prevent heart  disease and stroke. Watch your blood pressure  High blood pressure causes heart disease and increases the risk of stroke. This is more likely to develop in people who have high blood pressure readings, are of African descent, or are overweight.  Have your blood pressure checked: ? Every 3-5 years if you are 55-89 years of age. ? Every year if you are 62 years old or older. Eat a healthy diet  Eat a diet that includes plenty of vegetables, fruits, low-fat dairy products, and lean protein.  Do not eat a lot of foods that are high in solid fats, added sugars, or sodium.   Get regular exercise Get regular exercise. This is one of the most important things you can do for your health. Most adults should:  Try to exercise for at least 150 minutes each week. The exercise should increase your heart rate and make you sweat (moderate-intensity exercise).  Try to do strengthening exercises at least twice each week. Do these in addition to the moderate-intensity exercise.  Spend less time sitting. Even light physical activity can be beneficial. Other tips  Work with your health care provider to achieve or maintain a healthy weight.  Do not use any products that contain nicotine or tobacco, such as cigarettes, e-cigarettes, and chewing tobacco. If you need help quitting, ask your health care provider.  Know your numbers. Ask your health care provider to check your cholesterol and your blood sugar (glucose). Continue to have your blood tested as directed by your health care provider. Do I need screening for cancer? Depending on your health history and family history, you may need to have  cancer screening at different stages of your life. This may include screening for:  Breast cancer.  Cervical cancer.  Lung cancer.  Colorectal cancer. What is my risk for osteoporosis? After menopause, you may be at increased risk for osteoporosis. Osteoporosis is a condition in which bone destruction happens  more quickly than new bone creation. To help prevent osteoporosis or the bone fractures that can happen because of osteoporosis, you may take the following actions:  If you are 36-78 years old, get at least 1,000 mg of calcium and at least 600 mg of vitamin D per day.  If you are older than age 37 but younger than age 97, get at least 1,200 mg of calcium and at least 600 mg of vitamin D per day.  If you are older than age 51, get at least 1,200 mg of calcium and at least 800 mg of vitamin D per day. Smoking and drinking excessive alcohol increase the risk of osteoporosis. Eat foods that are rich in calcium and vitamin D, and do weight-bearing exercises several times each week as directed by your health care provider. How does menopause affect my mental health? Depression may occur at any age, but it is more common as you become older. Common symptoms of depression include:  Low or sad mood.  Changes in sleep patterns.  Changes in appetite or eating patterns.  Feeling an overall lack of motivation or enjoyment of activities that you previously enjoyed.  Frequent crying spells. Talk with your health care provider if you think that you are experiencing depression. General instructions See your health care provider for regular wellness exams and vaccines. This may include:  Scheduling regular health, dental, and eye exams.  Getting and maintaining your vaccines. These include: ? Influenza vaccine. Get this vaccine each year before the flu season begins. ? Pneumonia vaccine. ? Shingles vaccine. ? Tetanus, diphtheria, and pertussis (Tdap) booster vaccine. Your health care provider may also recommend other immunizations. Tell your health care provider if you have ever been abused or do not feel safe at home. Summary  Menopause is a normal process in which your ability to get pregnant comes to an end.  This condition causes hot flashes, night sweats, decreased interest in sex, mood  swings, headaches, or lack of sleep.  Treatment for this condition may include hormone replacement therapy.  Take actions to keep yourself healthy, including exercising regularly, eating a healthy diet, watching your weight, and checking your blood pressure and blood sugar levels.  Get screened for cancer and depression. Make sure that you are up to date with all your vaccines. This information is not intended to replace advice given to you by your health care provider. Make sure you discuss any questions you have with your health care provider. Document Revised: 10/23/2018 Document Reviewed: 10/23/2018 Elsevier Patient Education  2021 [employer].

## 2020-12-20 ENCOUNTER — Other Ambulatory Visit: Payer: Self-pay | Admitting: Nurse Practitioner

## 2020-12-20 DIAGNOSIS — L819 Disorder of pigmentation, unspecified: Secondary | ICD-10-CM

## 2020-12-20 DIAGNOSIS — L816 Other disorders of diminished melanin formation: Secondary | ICD-10-CM

## 2020-12-21 LAB — CYTOLOGY - PAP
Comment: NEGATIVE
Diagnosis: NEGATIVE
High risk HPV: NEGATIVE

## 2020-12-24 ENCOUNTER — Other Ambulatory Visit (HOSPITAL_COMMUNITY)
Admission: RE | Admit: 2020-12-24 | Discharge: 2020-12-24 | Disposition: A | Discharge status: Home / Self Care | Payer: BC Managed Care – PPO | Source: Ambulatory Visit | Attending: Nurse Practitioner | Admitting: Nurse Practitioner

## 2020-12-24 ENCOUNTER — Ambulatory Visit (INDEPENDENT_AMBULATORY_CARE_PROVIDER_SITE_OTHER): Payer: BC Managed Care – PPO | Admitting: Nurse Practitioner

## 2020-12-24 ENCOUNTER — Other Ambulatory Visit: Payer: Self-pay

## 2020-12-24 ENCOUNTER — Encounter: Payer: Self-pay | Admitting: Nurse Practitioner

## 2020-12-24 DIAGNOSIS — L819 Disorder of pigmentation, unspecified: Secondary | ICD-10-CM | POA: Diagnosis not present

## 2020-12-24 DIAGNOSIS — L816 Other disorders of diminished melanin formation: Secondary | ICD-10-CM

## 2020-12-24 DIAGNOSIS — N904 Leukoplakia of vulva: Secondary | ICD-10-CM | POA: Diagnosis not present

## 2020-12-24 NOTE — Progress Notes (Signed)
Physical Exam Genitourinary:       Shaded area is area of hypopigmentation Red X is area of biopsy   Area prepped with Betadine Numbed with lidocaine 1% 76mm punch biopsy obtained minimal bleeding Gauze applied Post care instructions given Will notify of biopsy results Suspect lichen sclerosis.

## 2020-12-24 NOTE — Patient Instructions (Signed)

## 2020-12-28 ENCOUNTER — Other Ambulatory Visit: Payer: Self-pay | Admitting: Nurse Practitioner

## 2020-12-28 DIAGNOSIS — N904 Leukoplakia of vulva: Secondary | ICD-10-CM

## 2020-12-28 LAB — SURGICAL PATHOLOGY

## 2020-12-28 MED ORDER — TRIAMCINOLONE ACETONIDE 0.1 % EX OINT: TOPICAL_OINTMENT | CUTANEOUS | 5 refills | Status: DC

## 2020-12-28 NOTE — Progress Notes (Signed)
Discussed biopsy results of Lichen sclerosis. Pt stated she is not that symptomatic at this time, will start with medium potency steroid. If symptoms worsen, will increase potency to clobetasol, then recheck. Advised to make sure to have evaluation annually. [place], [facility]

## 2021-03-21 ENCOUNTER — Encounter: Payer: BC Managed Care – PPO | Admitting: Internal Medicine

## 2021-04-21 ENCOUNTER — Encounter: Payer: Self-pay | Admitting: Internal Medicine

## 2021-05-03 NOTE — Progress Notes (Signed)
Subjective:    Patient ID: [name], female    DOB: 1961-10-03, 60 y.o.   MRN: [id number]   This visit occurred during the SARS-CoV-2 public health emergency.  Safety protocols were in place, including screening questions prior to the visit, additional usage of staff PPE, and extensive cleaning of exam room while observing appropriate contact time as indicated for disinfecting solutions.    HPI She is here for a physical exam.   She denies any changes in her health and has no concerns.  Medications and allergies reviewed with patient and updated if appropriate.  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    Past Medical History:  Diagnosis Date   Cervical dysplasia    CRP elevated    Hypercholesteremia     Past Surgical History:  Procedure Laterality Date   CERVICAL BIOPSY  W/ LOOP ELECTRODE EXCISION  10/04   LGSIL   CESAREAN SECTION     OOPHORECTOMY     ? RSO, DERMOID   TOTAL HIP ARTHROPLASTY      Social History   Socioeconomic History   Marital status: Married    Spouse name: Not on file   Number of children: Not on file   Years of education: Not on file   Highest education level: Not on file  Occupational History   Not on file  Tobacco Use   Smoking status: Never   Smokeless tobacco: Never  Vaping Use   Vaping Use: Never used  Substance and Sexual Activity   Alcohol use: Yes    Alcohol/week: 7.0 standard drinks    Types: 7 Standard drinks or equivalent per week   Drug use: No   Sexual activity: Yes    Partners: Male    Birth control/protection: Post-menopausal    Comment:  1st intercourse 60 yo-Fewer than 5 partners  Other Topics Concern   Not on file  Social History Narrative   Exercise - regularl   Social Determinants of Health   Financial Resource Strain: Not on file  Food Insecurity: Not on file  Transportation Needs: Not on file  Physical Activity: Not on file  Stress: Not on file  Social Connections: Not on file    Family History  Problem Relation Age of Onset   Cancer Mother        ov/ut   Breast cancer Mother 14   Hypertension Mother    Heart disease Father    Hypertension Father    Diabetes Brother    Kidney disease Brother    Breast cancer Sister        Age 26's   Cancer Sister        ov/ut    Review of Systems  Constitutional:  Negative for chills and fever.  Eyes:  Negative for visual disturbance.  Respiratory:  Negative for cough, shortness of breath and wheezing.   Cardiovascular:  Negative for chest pain, palpitations and leg swelling.  Gastrointestinal:  Negative for abdominal pain, blood in stool, constipation, diarrhea and nausea.  Genitourinary:  Negative for dysuria.  Musculoskeletal:  Negative for arthralgias and back pain.  Skin:  Negative for color change and rash.  Neurological:  Negative for dizziness, light-headedness and headaches.  Psychiatric/Behavioral:  Negative for dysphoric mood and sleep disturbance. The patient is nervous/anxious.       Objective:   Vitals:   05/04/21 1009  BP: 122/82  Pulse: 68  Temp: 98 F (36.7 C)  SpO2: 98%   Filed Weights   05/04/21 1009  Weight: 163 lb 9.6 oz (74.2 kg)   Body mass index is 24.88 kg/m.  BP Readings from Last 3 Encounters:  05/04/21 122/82  12/24/20 110/74  12/17/20 108/68    Wt Readings from Last 3 Encounters:  05/04/21 163 lb 9.6 oz (74.2 kg)  12/24/20 161 lb (73 kg)  12/17/20 161 lb (73 kg)     Physical Exam Constitutional: She appears well-developed and well-nourished. No distress.  HENT:  Head: Normocephalic and atraumatic.  Right  Ear: External ear normal. Normal ear canal and TM Left Ear: External ear normal.  Normal ear canal and TM Mouth/Throat: Oropharynx is clear and moist.  Eyes: Conjunctivae and EOM are normal.  Neck: Neck supple. No tracheal deviation present. No thyromegaly present.  No carotid bruit  Cardiovascular: Normal rate, regular rhythm and normal heart sounds.   No murmur heard.  No edema. Pulmonary/Chest: Effort normal and breath sounds normal. No respiratory distress. She has no wheezes. She has no rales.  Breast: deferred   Abdominal: Soft. She exhibits no distension. There is no tenderness.  Lymphadenopathy: She has no cervical adenopathy.  Skin: Skin is warm and dry. She is not diaphoretic.  Psychiatric: She has a normal mood and affect. Her behavior is normal.   The 10-year ASCVD risk score Denman George DC Jr., et al., 2013) is: 3.5%   Values used to calculate the score:     Age: 60 years     Sex: Female     Is Non-Hispanic African American: No     Diabetic: No     Tobacco smoker: No     Systolic Blood Pressure: 122 mmHg     Is BP treated: No     HDL Cholesterol: 60.4 mg/dL     Total Cholesterol: 235 mg/dL      Assessment & Plan:   Physical exam: Screening blood work    ordered Immunizations  discussed shingrix, will get booster 2 for covid Cologuard - up to date   Mammogram   Up to date  Gyn  Up to date  Eye exams  Up to date  Exercise  regular Weight  normal Substance abuse  none      See Problem List for Assessment and Plan of chronic medical problems.

## 2021-05-04 ENCOUNTER — Other Ambulatory Visit: Payer: Self-pay

## 2021-05-04 ENCOUNTER — Encounter: Payer: Self-pay | Admitting: Internal Medicine

## 2021-05-04 ENCOUNTER — Ambulatory Visit (INDEPENDENT_AMBULATORY_CARE_PROVIDER_SITE_OTHER): Payer: 59 | Admitting: Internal Medicine

## 2021-05-04 VITALS — BP 122/82 | HR 68 | Temp 98.0°F | Ht 68.0 in | Wt 163.6 lb

## 2021-05-04 DIAGNOSIS — E7800 Pure hypercholesterolemia, unspecified: Secondary | ICD-10-CM

## 2021-05-04 DIAGNOSIS — Z0000 Encounter for general adult medical examination without abnormal findings: Secondary | ICD-10-CM

## 2021-05-04 LAB — CBC WITH DIFFERENTIAL/PLATELET
Basophils Absolute: 0 10*3/uL (ref 0.0–0.1)
Basophils Relative: 0.6 % (ref 0.0–3.0)
Eosinophils Absolute: 0.2 10*3/uL (ref 0.0–0.7)
Eosinophils Relative: 3.2 % (ref 0.0–5.0)
HCT: 41 % (ref 36.0–46.0)
Hemoglobin: 13.8 g/dL (ref 12.0–15.0)
Lymphocytes Relative: 35.4 % (ref 12.0–46.0)
Lymphs Abs: 2.4 10*3/uL (ref 0.7–4.0)
MCHC: 33.7 g/dL (ref 30.0–36.0)
MCV: 90.1 fl (ref 78.0–100.0)
Monocytes Absolute: 0.6 10*3/uL (ref 0.1–1.0)
Monocytes Relative: 8.3 % (ref 3.0–12.0)
Neutro Abs: 3.6 10*3/uL (ref 1.4–7.7)
Neutrophils Relative %: 52.5 % (ref 43.0–77.0)
Platelets: 266 10*3/uL (ref 150.0–400.0)
RBC: 4.54 Mil/uL (ref 3.87–5.11)
RDW: 13.3 % (ref 11.5–15.5)
WBC: 6.8 10*3/uL (ref 4.0–10.5)

## 2021-05-04 LAB — COMPREHENSIVE METABOLIC PANEL
ALT: 17 U/L (ref 0–35)
AST: 23 U/L (ref 0–37)
Albumin: 4.5 g/dL (ref 3.5–5.2)
Alkaline Phosphatase: 71 U/L (ref 39–117)
BUN: 12 mg/dL (ref 6–23)
CO2: 27 mEq/L (ref 19–32)
Calcium: 9.9 mg/dL (ref 8.4–10.5)
Chloride: 104 mEq/L (ref 96–112)
Creatinine, Ser: 0.8 mg/dL (ref 0.40–1.20)
GFR: 79.51 mL/min (ref >60.00)
Glucose, Bld: 91 mg/dL (ref 70–99)
Potassium: 3.8 mEq/L (ref 3.5–5.1)
Sodium: 139 mEq/L (ref 135–145)
Total Bilirubin: 0.9 mg/dL (ref 0.2–1.2)
Total Protein: 7.5 g/dL (ref 6.0–8.3)

## 2021-05-04 LAB — LIPID PANEL
Cholesterol: 237 mg/dL — ABNORMAL HIGH (ref 0–200)
HDL: 67 mg/dL (ref >39.00)
LDL Cholesterol: 150 mg/dL — ABNORMAL HIGH (ref 0–99)
NonHDL: 169.91
Total CHOL/HDL Ratio: 4
Triglycerides: 99 mg/dL (ref 0.0–149.0)
VLDL: 19.8 mg/dL (ref 0.0–40.0)

## 2021-05-04 LAB — TSH: TSH: 1.55 u[IU]/mL (ref 0.35–4.50)

## 2021-05-04 NOTE — Patient Instructions (Signed)
Blood work was ordered.     Medications changes include :   none     Please followup in 1 year    Health Maintenance, Female Adopting a healthy lifestyle and getting preventive care are important in promoting health and wellness. Ask your health care provider about: The right schedule for you to have regular tests and exams. Things you can do on your own to prevent diseases and keep yourself healthy. What should I know about diet, weight, and exercise? Eat a healthy diet  Eat a diet that includes plenty of vegetables, fruits, low-fat dairy products, and lean protein. Do not eat a lot of foods that are high in solid fats, added sugars, or sodium.  Maintain a healthy weight Body mass index (BMI) is used to identify weight problems. It estimates body fat based on height and weight. Your health care provider can help determineyour BMI and help you achieve or maintain a healthy weight. Get regular exercise Get regular exercise. This is one of the most important things you can do for your health. Most adults should: Exercise for at least 150 minutes each week. The exercise should increase your heart rate and make you sweat (moderate-intensity exercise). Do strengthening exercises at least twice a week. This is in addition to the moderate-intensity exercise. Spend less time sitting. Even light physical activity can be beneficial. Watch cholesterol and blood lipids Have your blood tested for lipids and cholesterol at 61 years of age, then havethis test every 5 years. Have your cholesterol levels checked more often if: Your lipid or cholesterol levels are high. You are older than 61 years of age. You are at high risk for heart disease. What should I know about cancer screening? Depending on your health history and family history, you may need to have cancer screening at various ages. This may include screening for: Breast cancer. Cervical cancer. Colorectal cancer. Skin cancer. Lung  cancer. What should I know about heart disease, diabetes, and high blood pressure? Blood pressure and heart disease High blood pressure causes heart disease and increases the risk of stroke. This is more likely to develop in people who have high blood pressure readings, are of African descent, or are overweight. Have your blood pressure checked: Every 3-5 years if you are 18-39 years of age. Every year if you are 40 years old or older. Diabetes Have regular diabetes screenings. This checks your fasting blood sugar level. Have the screening done: Once every three years after age 40 if you are at a normal weight and have a low risk for diabetes. More often and at a younger age if you are overweight or have a high risk for diabetes. What should I know about preventing infection? Hepatitis B If you have a higher risk for hepatitis B, you should be screened for this virus. Talk with your health care provider to find out if you are at risk forhepatitis B infection. Hepatitis C Testing is recommended for: Everyone born from 1945 through 1965. Anyone with known risk factors for hepatitis C. Sexually transmitted infections (STIs) Get screened for STIs, including gonorrhea and chlamydia, if: You are sexually active and are younger than 61 years of age. You are older than 61 years of age and your health care provider tells you that you are at risk for this type of infection. Your sexual activity has changed since you were last screened, and you are at increased risk for chlamydia or gonorrhea. Ask your health care provider if you are   at risk. Ask your health care provider about whether you are at high risk for HIV. Your health care provider may recommend a prescription medicine to help prevent HIV infection. If you choose to take medicine to prevent HIV, you should first get tested for HIV. You should then be tested every 3 months for as long as you are taking the medicine. Pregnancy If you are about  to stop having your period (premenopausal) and you may become pregnant, seek counseling before you get pregnant. Take 400 to 800 micrograms (mcg) of folic acid every day if you become pregnant. Ask for birth control (contraception) if you want to prevent pregnancy. Osteoporosis and menopause Osteoporosis is a disease in which the bones lose minerals and strength with aging. This can result in bone fractures. If you are 65 years old or older, or if you are at risk for osteoporosis and fractures, ask your health care provider if you should: Be screened for bone loss. Take a calcium or vitamin D supplement to lower your risk of fractures. Be given hormone replacement therapy (HRT) to treat symptoms of menopause. Follow these instructions at home: Lifestyle Do not use any products that contain nicotine or tobacco, such as cigarettes, e-cigarettes, and chewing tobacco. If you need help quitting, ask your health care provider. Do not use street drugs. Do not share needles. Ask your health care provider for help if you need support or information about quitting drugs. Alcohol use Do not drink alcohol if: Your health care provider tells you not to drink. You are pregnant, may be pregnant, or are planning to become pregnant. If you drink alcohol: Limit how much you use to 0-1 drink a day. Limit intake if you are breastfeeding. Be aware of how much alcohol is in your drink. In the [place] one drink equals one 12 oz bottle of beer (355 mL), one 5 oz glass of wine (148 mL), or one 1 oz glass of hard liquor (44 mL). General instructions Schedule regular health, dental, and eye exams. Stay current with your vaccines. Tell your health care provider if: You often feel depressed. You have ever been abused or do not feel safe at home. Summary Adopting a healthy lifestyle and getting preventive care are important in promoting health and wellness. Follow your health care provider's instructions about  healthy diet, exercising, and getting tested or screened for diseases. Follow your health care provider's instructions on monitoring your cholesterol and blood pressure. This information is not intended to replace advice given to you by your health care provider. Make sure you discuss any questions you have with your healthcare provider. Document Revised: 10/23/2018 Document Reviewed: 10/23/2018 Elsevier Patient Education  2022 [employer]  

## 2021-05-04 NOTE — Assessment & Plan Note (Signed)
Chronic Diet controlled Continue healthy diet, regular exercise Check lipid panel

## 2021-09-19 ENCOUNTER — Other Ambulatory Visit: Payer: Self-pay | Admitting: Obstetrics and Gynecology

## 2021-09-19 ENCOUNTER — Other Ambulatory Visit: Payer: Self-pay | Admitting: Internal Medicine

## 2021-09-19 DIAGNOSIS — Z1231 Encounter for screening mammogram for malignant neoplasm of breast: Secondary | ICD-10-CM

## 2021-10-21 ENCOUNTER — Ambulatory Visit
Admission: RE | Admit: 2021-10-21 | Discharge: 2021-10-21 | Disposition: A | Discharge status: Home / Self Care | Payer: 59 | Source: Ambulatory Visit | Attending: Obstetrics and Gynecology | Admitting: Obstetrics and Gynecology

## 2021-10-21 DIAGNOSIS — Z1231 Encounter for screening mammogram for malignant neoplasm of breast: Secondary | ICD-10-CM

## 2021-12-19 ENCOUNTER — Other Ambulatory Visit: Payer: Self-pay

## 2021-12-19 ENCOUNTER — Ambulatory Visit: Payer: BC Managed Care – PPO | Admitting: Nurse Practitioner

## 2021-12-19 ENCOUNTER — Ambulatory Visit (INDEPENDENT_AMBULATORY_CARE_PROVIDER_SITE_OTHER): Payer: 59 | Admitting: Nurse Practitioner

## 2021-12-19 ENCOUNTER — Encounter: Payer: Self-pay | Admitting: Nurse Practitioner

## 2021-12-19 ENCOUNTER — Telehealth: Payer: Self-pay

## 2021-12-19 VITALS — BP 114/66 | Ht 68.0 in | Wt 164.0 lb

## 2021-12-19 DIAGNOSIS — N952 Postmenopausal atrophic vaginitis: Secondary | ICD-10-CM

## 2021-12-19 DIAGNOSIS — Z780 Asymptomatic menopausal state: Secondary | ICD-10-CM | POA: Diagnosis not present

## 2021-12-19 DIAGNOSIS — N904 Leukoplakia of vulva: Secondary | ICD-10-CM | POA: Diagnosis not present

## 2021-12-19 DIAGNOSIS — Z809 Family history of malignant neoplasm, unspecified: Secondary | ICD-10-CM

## 2021-12-19 DIAGNOSIS — Z01419 Encounter for gynecological examination (general) (routine) without abnormal findings: Secondary | ICD-10-CM

## 2021-12-19 DIAGNOSIS — N941 Unspecified dyspareunia: Secondary | ICD-10-CM

## 2021-12-19 MED ORDER — ESTRADIOL 0.1 MG/GM VA CREA: 1.0000 g | TOPICAL_CREAM | VAGINAL | 12 refills | Status: DC

## 2021-12-19 NOTE — Telephone Encounter (Signed)
Genetics referral Received: Today Tamela Gammon, NP  [facility] Triage Please send referral for genetic counseling due to family history of breast, uterine, and ovarian cancers. Thank you.

## 2021-12-19 NOTE — Telephone Encounter (Signed)
Referral order placed.

## 2021-12-19 NOTE — Progress Notes (Signed)
[name] 05-27-1961 [id number]   History:  61 y.o. [id number] presents for annual exam. Postmenopausal - no HRT, no bleeding. She complains of pain with intercourse. Using a lubricant with no improvement. 2004 LEEP, paps normal since. Lichen sclerosus confirmed by biopsy 12/2020, using Kenalog ointment occasionally.   Gynecologic History Patient's last menstrual period was 08/13/2012.   Contraception/Family planning: post menopausal status Sexually active: Yes  Health Maintenance Last Pap: 12/17/2020. Results were: Normal, 5-year repeat Last mammogram: 10/21/2021. Results were: Normal Last colonoscopy: Never. Negative Cologuard in 2021 Last Dexa: Never  Past medical history, past surgical history, family history and social history were all reviewed and documented in the EPIC chart. Married. Works for [employer]. Daughter lives local, has identical twin boys age 27, 9 yo son. Patient helps out in the afternoons. Son lives in [place], recently married. Mother diagnosed with breast cancer in her 65s, sister in her 21s. Both also had ovarian and uterine cancer. Mother deceased and no relationship with sister, unsure if genetic testing done.   ROS:  A ROS was performed and pertinent positives and negatives are included.  Exam:  Vitals:   12/19/21 1412  BP: 114/66  Weight: 164 lb (74.4 kg)  Height: 5\' 8"  (1.727 m)   Body mass index is 24.94 kg/m.  General appearance:  Normal Thyroid:  Symmetrical, normal in size, without palpable masses or nodularity. Respiratory  Auscultation:  Clear without wheezing or rhonchi Cardiovascular  Auscultation:  Regular rate, without rubs, murmurs or gallops  Edema/varicosities:  Not grossly evident Abdominal  Soft,nontender, without masses, guarding or rebound.  Liver/spleen:  No organomegaly noted  Hernia:  None appreciated  Skin  Inspection:  Grossly normal Breasts: Examined lying and sitting.   Right: Without masses,  retractions, nipple discharge or axillary adenopathy.   Left: Without masses, retractions, nipple discharge or axillary adenopathy. Genitourinary   Inguinal/mons:  Normal without inguinal adenopathy  External genitalia:  Normal appearing vulva with no masses, tenderness, or lesions. Hypopigmentation in folds of labia minora and majora c/w LS  BUS/Urethra/Skene's glands:  Normal  Vagina:  Normal appearing with normal color and discharge, no lesions. Atrophic changes  Cervix:  Normal appearing without discharge or lesions  Uterus:  Normal in size, shape and contour.  Midline and mobile, nontender  Adnexa/parametria:     Rt: Normal in size, without masses or tenderness.   Lt: Normal in size, without masses or tenderness.  Anus and perineum: Normal  Digital rectal exam: Normal sphincter tone without palpated masses or tenderness  Patient informed chaperone available to be present for breast and pelvic exam. Patient has requested no chaperone to be present. Patient has been advised what will be completed during breast and pelvic exam.   Assessment/Plan:  61 y.o. 67 for annual exam.   Well female exam with routine gynecological exam - Education provided on SBEs, importance of preventative screenings, current guidelines, high calcium diet, regular exercise, and multivitamin daily. Labs with PCP.   Postmenopausal - no HRT, no bleeding.   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 Confirmed by biopsy last year. Using Kenalog ointment occasionally.   Vaginal atrophy - Plan: estradiol (ESTRACE VAGINAL) 0.1 MG/GM vaginal cream twice weekly. Discussed risk for small amount of absorption increasing risks for blood clots, heart attack, stroke, and breast cancer.   Dyspareunia in female - Plan: estradiol (ESTRACE VAGINAL) 0.1 MG/GM vaginal cream twice weekly. Intercourse is very painful, even with lubrication. Recommend using coconut oil as well for intercourse.  Family history of  cancer - mother and sister with history of breast, uterine, and ovarian cancer. Patient unsure if genetic testing was performed. Mother is deceased and sister is estranged. Recommend genetic testing and she is agreeable. She does have a daughter.   Screening for cervical cancer - 2004 LEEP, paps normal since. Will repeat at 5-year interval per guidelines.  Screening for breast cancer - Normal mammogram history.  Continue annual screenings.  Normal breast exam today.  Screening for colon cancer - Negative Cologuard in 2021.   Screening for osteoporosis - Average risk. Will plan for DXA at age 24. She is very active with exercise. Recommend taking daily Vitamin D supplement and consuming high calcium diet.    Return in 1 year for annual.     Olivia Mackie DNP, 2:36 PM 12/19/2021

## 2021-12-20 NOTE — Telephone Encounter (Signed)
[facility] called patient to schedule and wrote "Spoke to pt who said she needs to check to see if her insurance will cover this appt before scheduling. She told me she will call back to sch this appt. Referral closed until I hear back from pt. "  I am going to close this encounter if okay?

## 2021-12-21 NOTE — Telephone Encounter (Signed)
Yes that's fine 

## 2022-05-04 ENCOUNTER — Encounter: Payer: Self-pay | Admitting: Internal Medicine

## 2022-05-04 NOTE — Progress Notes (Unsigned)
Subjective:    Patient ID: [name], female    DOB: 06/14/1961, 61 y.o.   MRN: [id number]      HPI Adreona is here for a Physical exam.      Medications and allergies reviewed with patient and updated if appropriate.  Current Outpatient Medications on File Prior to Visit  Medication Sig Dispense Refill   estradiol (ESTRACE VAGINAL) 0.1 MG/GM vaginal cream Place 1 g vaginally 2 (two) times a week. Initial dose: Nightly x 1 week, then every other night x 1 week, then twice weekly 42.5 g 12   triamcinolone ointment (KENALOG) 0.1 % Initially apply daily x 14 days, then apply twice weekly for maintenance 30 g 5   No current facility-administered medications on file prior to visit.    Review of Systems     Objective:  There were no vitals filed for this visit. There were no vitals filed for this visit. There is no height or weight on file to calculate BMI.  BP Readings from Last 3 Encounters:  12/19/21 114/66  05/04/21 122/82  12/24/20 110/74    Wt Readings from Last 3 Encounters:  12/19/21 164 lb (74.4 kg)  05/04/21 163 lb 9.6 oz (74.2 kg)  12/24/20 161 lb (73 kg)       Physical Exam Constitutional: She appears well-developed and well-nourished. No distress.  HENT:  Head: Normocephalic and atraumatic.  Right Ear: External ear normal. Normal ear canal and TM Left Ear: External ear normal.  Normal ear canal and TM Mouth/Throat: Oropharynx is clear and moist.  Eyes: Conjunctivae normal.  Neck: Neck supple. No tracheal deviation present. No thyromegaly present.  No carotid bruit  Cardiovascular: Normal rate, regular rhythm and normal heart sounds.   No murmur heard.  No edema. Pulmonary/Chest: Effort normal and breath sounds normal. No respiratory distress. She has no wheezes. She has no rales.  Breast: deferred   Abdominal: Soft. She exhibits no distension. There is no tenderness.  Lymphadenopathy: She has no cervical adenopathy.  Skin: Skin is  warm and dry. She is not diaphoretic.  Psychiatric: She has a normal mood and affect. Her behavior is normal.     Lab Results  Component Value Date   WBC 6.8 05/04/2021   HGB 13.8 05/04/2021   HCT 41.0 05/04/2021   PLT 266.0 05/04/2021   GLUCOSE 91 05/04/2021   CHOL 237 (H) 05/04/2021   TRIG 99.0 05/04/2021   HDL 67.00 05/04/2021   LDLCALC 150 (H) 05/04/2021   ALT 17 05/04/2021   AST 23 05/04/2021   NA 139 05/04/2021   K 3.8 05/04/2021   CL 104 05/04/2021   CREATININE 0.80 05/04/2021   BUN 12 05/04/2021   CO2 27 05/04/2021   TSH 1.55 05/04/2021   INR 0.9 01/20/2008   HGBA1C 5.1 07/25/2017    The 10-year ASCVD risk score (Arnett DK, et al., 2019) is: 3.3%   Values used to calculate the score:     Age: 52 years     Sex: Female     Is Non-Hispanic African American: No     Diabetic: No     Tobacco smoker: No     Systolic Blood Pressure: 114 mmHg     Is BP treated: No     HDL Cholesterol: 67 mg/dL     Total Cholesterol: 237 mg/dL      Assessment & Plan:   Physical exam: Screening blood work  ordered Exercise  regular  Weight  good  Substance  abuse  none   Reviewed recommended immunizations.   Health Maintenance  Topic Date Due   Zoster Vaccines- Shingrix (1 of 2) Never done   COVID-19 Vaccine (4 - Moderna series) 02/11/2021   INFLUENZA VACCINE  06/13/2022   Fecal DNA (Cologuard)  05/12/2023   MAMMOGRAM  10/22/2023   PAP SMEAR-Modifier  12/18/2023   TETANUS/TDAP  03/14/2026   Hepatitis C Screening  Completed   HIV Screening  Completed   HPV VACCINES  Aged Out          See Problem List for Assessment and Plan of chronic medical problems.

## 2022-05-05 ENCOUNTER — Ambulatory Visit (INDEPENDENT_AMBULATORY_CARE_PROVIDER_SITE_OTHER): Payer: 59 | Admitting: Internal Medicine

## 2022-05-05 VITALS — BP 108/74 | HR 64 | Temp 98.4°F | Ht 68.0 in | Wt 165.0 lb

## 2022-05-05 DIAGNOSIS — E7800 Pure hypercholesterolemia, unspecified: Secondary | ICD-10-CM

## 2022-05-05 DIAGNOSIS — Z0000 Encounter for general adult medical examination without abnormal findings: Secondary | ICD-10-CM | POA: Diagnosis not present

## 2022-05-05 LAB — CBC WITH DIFFERENTIAL/PLATELET
Basophils Absolute: 0 10*3/uL (ref 0.0–0.1)
Basophils Relative: 0.7 % (ref 0.0–3.0)
Eosinophils Absolute: 0.2 10*3/uL (ref 0.0–0.7)
Eosinophils Relative: 3.5 % (ref 0.0–5.0)
HCT: 42.1 % (ref 36.0–46.0)
Hemoglobin: 14.2 g/dL (ref 12.0–15.0)
Lymphocytes Relative: 36.4 % (ref 12.0–46.0)
Lymphs Abs: 2.3 10*3/uL (ref 0.7–4.0)
MCHC: 33.7 g/dL (ref 30.0–36.0)
MCV: 90.8 fl (ref 78.0–100.0)
Monocytes Absolute: 0.6 10*3/uL (ref 0.1–1.0)
Monocytes Relative: 9 % (ref 3.0–12.0)
Neutro Abs: 3.2 10*3/uL (ref 1.4–7.7)
Neutrophils Relative %: 50.4 % (ref 43.0–77.0)
Platelets: 255 10*3/uL (ref 150.0–400.0)
RBC: 4.64 Mil/uL (ref 3.87–5.11)
RDW: 13.2 % (ref 11.5–15.5)
WBC: 6.3 10*3/uL (ref 4.0–10.5)

## 2022-05-05 LAB — COMPREHENSIVE METABOLIC PANEL
ALT: 16 U/L (ref 0–35)
AST: 20 U/L (ref 0–37)
Albumin: 4.5 g/dL (ref 3.5–5.2)
Alkaline Phosphatase: 69 U/L (ref 39–117)
BUN: 18 mg/dL (ref 6–23)
CO2: 28 mEq/L (ref 19–32)
Calcium: 9.8 mg/dL (ref 8.4–10.5)
Chloride: 104 mEq/L (ref 96–112)
Creatinine, Ser: 0.78 mg/dL (ref 0.40–1.20)
GFR: 81.39 mL/min (ref >60.00)
Glucose, Bld: 86 mg/dL (ref 70–99)
Potassium: 4.1 mEq/L (ref 3.5–5.1)
Sodium: 140 mEq/L (ref 135–145)
Total Bilirubin: 0.8 mg/dL (ref 0.2–1.2)
Total Protein: 7.3 g/dL (ref 6.0–8.3)

## 2022-05-05 LAB — TSH: TSH: 1.69 u[IU]/mL (ref 0.35–5.50)

## 2022-05-05 LAB — LIPID PANEL
Cholesterol: 240 mg/dL — ABNORMAL HIGH (ref 0–200)
HDL: 61 mg/dL (ref >39.00)
LDL Cholesterol: 153 mg/dL — ABNORMAL HIGH (ref 0–99)
NonHDL: 178.54
Total CHOL/HDL Ratio: 4
Triglycerides: 126 mg/dL (ref 0.0–149.0)
VLDL: 25.2 mg/dL (ref 0.0–40.0)

## 2022-05-05 NOTE — Assessment & Plan Note (Addendum)
Chronic Regular exercise and healthy diet encouraged Check lipid panel, CMP, CBC Currently controlled with lifestyle  Low ASCVD risk

## 2022-07-15 ENCOUNTER — Encounter: Payer: Self-pay | Admitting: Internal Medicine

## 2022-07-18 NOTE — Telephone Encounter (Signed)
Spoke with patient today.  Toe is doing much better an swelling has improved since then. She was back at work today and said she would call back if foot swells again.

## 2022-12-06 ENCOUNTER — Encounter: Payer: Self-pay | Admitting: Nurse Practitioner

## 2022-12-06 ENCOUNTER — Other Ambulatory Visit: Payer: Self-pay | Admitting: Nurse Practitioner

## 2022-12-06 DIAGNOSIS — Z1231 Encounter for screening mammogram for malignant neoplasm of breast: Secondary | ICD-10-CM

## 2022-12-15 DIAGNOSIS — R059 Cough, unspecified: Secondary | ICD-10-CM | POA: Diagnosis not present

## 2022-12-29 ENCOUNTER — Telehealth (INDEPENDENT_AMBULATORY_CARE_PROVIDER_SITE_OTHER): Payer: Commercial Managed Care - HMO | Admitting: Family Medicine

## 2022-12-29 VITALS — Ht 68.0 in | Wt 165.0 lb

## 2022-12-29 DIAGNOSIS — J029 Acute pharyngitis, unspecified: Secondary | ICD-10-CM

## 2022-12-29 DIAGNOSIS — U071 COVID-19: Secondary | ICD-10-CM

## 2022-12-29 LAB — POCT RAPID STREP A (OFFICE): Rapid Strep A Screen: NEGATIVE

## 2022-12-29 LAB — POC COVID19 BINAXNOW: SARS Coronavirus 2 Ag: POSITIVE — AB

## 2022-12-29 NOTE — Progress Notes (Signed)
Patient ID: [name], female   DOB: [birth year], 62 y.o.   MRN: [id number]   Virtual Visit via Video Note  I connected with [name] on 12/29/22 at 11:45 AM EST by a video enabled telemedicine application and verified that I am speaking with the correct person using two identifiers.  Location patient: home Location provider:work or home office Persons participating in the virtual visit: patient, provider  I discussed the limitations of evaluation and management by telemedicine and the availability of in person appointments. The patient expressed understanding and agreed to proceed.   HPI: [name] is seen with acute illness.  She started on Monday with some headaches and some congestion and bodyaches.  Questionable low-grade fever.  She has had increased malaise and some pressure around both eyes.  She has been taking some Advil which does relieve her headache temporarily.  Her husband had cold-like symptoms earlier in the week.  He tested negative for COVID at home.  [name] had not tested until today and test here came back positive for COVID.  Denies any nausea, vomiting, or diarrhea.  She has still been trying to work out some this week but has had increased malaise.  She feels some better today than yesterday.  Worst part of her illness has been the headaches.  They are temporarily relieved with Advil.  She saw grandchildren last weekend and thinks she may have picked up something from them.   ROS: See pertinent positives and negatives per HPI.  Past Medical History:  Diagnosis Date   Cervical dysplasia    CRP elevated    Hypercholesteremia     Past Surgical History:  Procedure Laterality Date   CERVICAL BIOPSY  W/ LOOP ELECTRODE EXCISION  10/04   LGSIL   CESAREAN SECTION     OOPHORECTOMY     ? RSO, DERMOID   TOTAL HIP ARTHROPLASTY      Family History  Problem Relation Age of Onset   Cancer Mother        ov/ut   Breast cancer Mother 30   Hypertension  Mother    Heart disease Father    Hypertension Father    Diabetes Brother    Kidney disease Brother    Breast cancer Sister        Age 48's   Cancer Sister        ov/ut    SOCIAL HX: Non-smoker   Current Outpatient Medications:    estradiol (ESTRACE VAGINAL) 0.1 MG/GM vaginal cream, Place 1 g vaginally 2 (two) times a week. Initial dose: Nightly x 1 week, then every other night x 1 week, then twice weekly, Disp: 42.5 g, Rfl: 12  EXAM:  VITALS per patient if applicable:  GENERAL: alert, oriented, appears well and in no acute distress  HEENT: atraumatic, conjunttiva clear, no obvious abnormalities on inspection of external nose and ears  NECK: normal movements of the head and neck  LUNGS: on inspection no signs of respiratory distress, breathing rate appears normal, no obvious gross SOB, gasping or wheezing  CV: no obvious cyanosis  MS: moves all visible extremities without noticeable abnormality  PSYCH/NEURO: pleasant and cooperative, no obvious depression or anxiety, speech and thought processing grossly intact  ASSESSMENT AND PLAN:  Discussed the following assessment and plan:  COVID-19 positive.  Patient nontoxic in appearance.  She is actually doing some better today.  We discussed pros and cons of antiviral therapy.  Since she is on day 4 and some improved we decided against antivirals  at this time.  Continue over-the-counter analgesics as needed.  Plenty fluids and rest.  Follow-up for any fever or worsening symptoms     I discussed the assessment and treatment plan with the patient. The patient was provided an opportunity to ask questions and all were answered. The patient agreed with the plan and demonstrated an understanding of the instructions.   The patient was advised to call back or seek an in-person evaluation if the symptoms worsen or if the condition fails to improve as anticipated.     Carolann Littler, MD

## 2023-01-10 ENCOUNTER — Ambulatory Visit (INDEPENDENT_AMBULATORY_CARE_PROVIDER_SITE_OTHER): Payer: Commercial Managed Care - HMO | Admitting: Nurse Practitioner

## 2023-01-10 ENCOUNTER — Encounter: Payer: Self-pay | Admitting: Nurse Practitioner

## 2023-01-10 VITALS — BP 100/78 | Ht 68.5 in | Wt 169.0 lb

## 2023-01-10 DIAGNOSIS — Z809 Family history of malignant neoplasm, unspecified: Secondary | ICD-10-CM | POA: Diagnosis not present

## 2023-01-10 DIAGNOSIS — N904 Leukoplakia of vulva: Secondary | ICD-10-CM | POA: Diagnosis not present

## 2023-01-10 DIAGNOSIS — Z01419 Encounter for gynecological examination (general) (routine) without abnormal findings: Secondary | ICD-10-CM | POA: Diagnosis not present

## 2023-01-10 DIAGNOSIS — Z780 Asymptomatic menopausal state: Secondary | ICD-10-CM

## 2023-01-10 DIAGNOSIS — N952 Postmenopausal atrophic vaginitis: Secondary | ICD-10-CM

## 2023-01-10 MED ORDER — ESTRADIOL 0.1 MG/GM VA CREA: 1.0000 g | TOPICAL_CREAM | VAGINAL | 2 refills | Status: DC

## 2023-01-10 NOTE — Progress Notes (Signed)
[name] 1961-06-06 [id number]   History:  62 y.o. [id number] presents for annual exam. Postmenopausal - no HRT, no bleeding. Using vaginal estrogen for dryness and painful intercourse. 2004 LEEP, paps normal since. Lichen sclerosus confirmed by biopsy 12/2020, using Aquafor with good management. Mother and sister with history of breast, uterine, and ovarian cancer. Declines genetic testing. H/O RSO for dermoid cyst.   Gynecologic History Patient's last menstrual period was 08/13/2012.   Contraception/Family planning: post menopausal status Sexually active: Yes  Health Maintenance Last Pap: 12/17/2020. Results were: Normal neg HPV, 5-year repeat Last mammogram: 10/21/2021. Results were: Normal. Scheduled 01/25/23 Last colonoscopy: Never. Negative Cologuard in 2021 Last Dexa: Never  Past medical history, past surgical history, family history and social history were all reviewed and documented in the EPIC chart. Married. 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 Daughter lives local, has identical twin boys age 74, 10 yo son. Son lives in [place], expecting first child. Mother diagnosed with breast cancer in her 29s, sister in her 31s. Both also had ovarian and uterine cancer. Mother deceased and no relationship with sister, unsure if genetic testing done.   ROS:  A ROS was performed and pertinent positives and negatives are included.  Exam:  Vitals:   01/10/23 0801  BP: 100/78  Weight: 169 lb (76.7 kg)  Height: 5' 8.5" (1.74 m)    Body mass index is 25.32 kg/m.  General appearance:  Normal Thyroid:  Symmetrical, normal in size, without palpable masses or nodularity. Respiratory  Auscultation:  Clear without wheezing or rhonchi Cardiovascular  Auscultation:  Regular rate, without rubs, murmurs or gallops  Edema/varicosities:  Not grossly evident Abdominal  Soft,nontender, without masses, guarding or rebound.  Liver/spleen:  No organomegaly noted  Hernia:  None  appreciated  Skin  Inspection:  Grossly normal Breasts: Examined lying and sitting.   Right: Without masses, retractions, nipple discharge or axillary adenopathy.   Left: Without masses, retractions, nipple discharge or axillary adenopathy. Genitourinary   Inguinal/mons:  Normal without inguinal adenopathy  External genitalia:  Normal appearing vulva with no masses, tenderness, or lesions. Very mild hypopigmentation in folds of labia minora and majora c/w LS  BUS/Urethra/Skene's glands:  Normal  Vagina:  Normal appearing with normal color and discharge, no lesions. Atrophic changes  Cervix:  Normal appearing without discharge or lesions  Uterus:  Normal in size, shape and contour.  Midline and mobile, nontender  Adnexa/parametria:     Rt: Normal in size, without masses or tenderness.   Lt: Normal in size, without masses or tenderness.  Anus and perineum: Normal  Digital rectal exam: Deferred  Patient informed chaperone available to be present for breast and pelvic exam. Patient has requested no chaperone to be present. Patient has been advised what will be completed during breast and pelvic exam.   Assessment/Plan:  62 y.o. [id number] for annual exam.   Well female exam with routine gynecological exam - Education provided on SBEs, importance of preventative screenings, current guidelines, high calcium diet, regular exercise, and multivitamin daily. Labs with PCP.   Postmenopausal - no HRT, no bleeding.   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 Confirmed by biopsy last year. Using Aquafor occasionally.   Vaginal atrophy - Plan: estradiol (ESTRACE VAGINAL) 0.1 MG/GM vaginal cream twice weekly. Discussed risk for small amount of absorption increasing risks for blood clots, heart attack, stroke, and breast cancer.   Family history of cancer - Mother and sister with history of breast, uterine, and ovarian cancer. Patient  unsure if genetic testing was performed. Mother is deceased  and sister is estranged. Declines genetic testing.   Screening for cervical cancer - 2004 LEEP, paps normal since. Will repeat at 5-year interval per guidelines.  Screening for breast cancer - Normal mammogram history. Overdue but scheduled in March. Normal breast exam today.  Screening for colon cancer - Negative Cologuard in 2021. Recommend repeating this year. Will discuss with PCP.   Screening for osteoporosis - Average risk. Will plan for DXA at age 89. She is very active with exercise. Recommend taking daily Vitamin D supplement and consuming high calcium diet.    Return in 1 year for annual.     Tamela Gammon DNP, 8:25 AM 01/10/2023

## 2023-01-25 ENCOUNTER — Ambulatory Visit: Payer: Self-pay

## 2023-03-12 ENCOUNTER — Inpatient Hospital Stay: Admission: RE | Admit: 2023-03-12 | Payer: Self-pay | Source: Ambulatory Visit

## 2023-04-12 ENCOUNTER — Ambulatory Visit
Admission: RE | Admit: 2023-04-12 | Discharge: 2023-04-12 | Disposition: A | Discharge status: Home / Self Care | Payer: Commercial Managed Care - HMO | Source: Ambulatory Visit | Attending: Nurse Practitioner | Admitting: Nurse Practitioner

## 2023-04-12 DIAGNOSIS — Z1231 Encounter for screening mammogram for malignant neoplasm of breast: Secondary | ICD-10-CM

## 2023-05-06 ENCOUNTER — Encounter: Payer: Self-pay | Admitting: Internal Medicine

## 2023-05-06 NOTE — Progress Notes (Unsigned)
    Subjective:    Patient ID: [name], female    DOB: 1961/02/29, 62 y.o.   MRN: [id number]      HPI [name] is here for a Physical exam and her chronic medical problems.    Doing  well - no concerns.   Medications and allergies reviewed with patient and updated if appropriate.  Current Outpatient Medications on File Prior to Visit  Medication Sig Dispense Refill   estradiol (ESTRACE VAGINAL) 0.1 MG/GM vaginal cream Place 1 g vaginally 2 (two) times a week. 42.5 g 2   No current facility-administered medications on file prior to visit.    Review of Systems     Objective:  There were no vitals filed for this visit. There were no vitals filed for this visit. There is no height or weight on file to calculate BMI.  BP Readings from Last 3 Encounters:  01/10/23 100/78  05/05/22 108/74  12/19/21 114/66    Wt Readings from Last 3 Encounters:  01/10/23 169 lb (76.7 kg)  12/29/22 165 lb (74.8 kg)  05/05/22 165 lb (74.8 kg)       Physical Exam Constitutional: She appears well-developed and well-nourished. No distress.  HENT:  Head: Normocephalic and atraumatic.  Right Ear: External ear normal. Normal ear canal and TM Left Ear: External ear normal.  Normal ear canal and TM Mouth/Throat: Oropharynx is clear and moist.  Eyes: Conjunctivae normal.  Neck: Neck supple. No tracheal deviation present. No thyromegaly present.  No carotid bruit  Cardiovascular: Normal rate, regular rhythm and normal heart sounds.   No murmur heard.  No edema. Pulmonary/Chest: Effort normal and breath sounds normal. No respiratory distress. She has no wheezes. She has no rales.  Breast: deferred   Abdominal: Soft. She exhibits no distension. There is no tenderness.  Lymphadenopathy: She has no cervical adenopathy.  Skin: Skin is warm and dry. She is not diaphoretic.  Psychiatric: She has a normal mood and affect. Her behavior is normal.     Lab Results  Component Value Date    WBC 6.3 05/05/2022   HGB 14.2 05/05/2022   HCT 42.1 05/05/2022   PLT 255.0 05/05/2022   GLUCOSE 86 05/05/2022   CHOL 240 (H) 05/05/2022   TRIG 126.0 05/05/2022   HDL 61.00 05/05/2022   LDLCALC 153 (H) 05/05/2022   ALT 16 05/05/2022   AST 20 05/05/2022   NA 140 05/05/2022   K 4.1 05/05/2022   CL 104 05/05/2022   CREATININE 0.78 05/05/2022   BUN 18 05/05/2022   CO2 28 05/05/2022   TSH 1.69 05/05/2022   INR 0.9 01/20/2008   HGBA1C 5.1 07/25/2017         Assessment & Plan:   Physical exam: Screening blood work  ordered Exercise   regular Weight   is ok Substance abuse  none   Reviewed recommended immunizations.   Health Maintenance  Topic Date Due   Zoster Vaccines- Shingrix (1 of 2) Never done   COVID-19 Vaccine (4 - 2023-24 season) 07/14/2022   Fecal DNA (Cologuard)  05/12/2023   INFLUENZA VACCINE  06/14/2023   PAP SMEAR-Modifier  12/18/2023   MAMMOGRAM  04/11/2025   DTaP/Tdap/Td (2 - Td or Tdap) 03/14/2026   Hepatitis C Screening  Completed   HIV Screening  Completed   HPV VACCINES  Aged Out          See Problem List for Assessment and Plan of chronic medical problems.

## 2023-05-06 NOTE — Patient Instructions (Addendum)
Blood work was ordered.   The lab is on the first floor.    Medications changes include :   none   A Ct coronary calcium score test was ordered.   Return in about 1 year (around 05/06/2024) for Physical Exam.    Health Maintenance, Female Adopting a healthy lifestyle and getting preventive care are important in promoting health and wellness. Ask your health care provider about: The right schedule for you to have regular tests and exams. Things you can do on your own to prevent diseases and keep yourself healthy. What should I know about diet, weight, and exercise? Eat a healthy diet  Eat a diet that includes plenty of vegetables, fruits, low-fat dairy products, and lean protein. Do not eat a lot of foods that are high in solid fats, added sugars, or sodium. Maintain a healthy weight Body mass index (BMI) is used to identify weight problems. It estimates body fat based on height and weight. Your health care provider can help determine your BMI and help you achieve or maintain a healthy weight. Get regular exercise Get regular exercise. This is one of the most important things you can do for your health. Most adults should: Exercise for at least 150 minutes each week. The exercise should increase your heart rate and make you sweat (moderate-intensity exercise). Do strengthening exercises at least twice a week. This is in addition to the moderate-intensity exercise. Spend less time sitting. Even light physical activity can be beneficial. Watch cholesterol and blood lipids Have your blood tested for lipids and cholesterol at 63 years of age, then have this test every 5 years. Have your cholesterol levels checked more often if: Your lipid or cholesterol levels are high. You are older than 63 years of age. You are at high risk for heart disease. What should I know about cancer screening? Depending on your health history and family history, you may need to have cancer screening  at various ages. This may include screening for: Breast cancer. Cervical cancer. Colorectal cancer. Skin cancer. Lung cancer. What should I know about heart disease, diabetes, and high blood pressure? Blood pressure and heart disease High blood pressure causes heart disease and increases the risk of stroke. This is more likely to develop in people who have high blood pressure readings or are overweight. Have your blood pressure checked: Every 3-5 years if you are 84-76 years of age. Every year if you are 40 years old or older. Diabetes Have regular diabetes screenings. This checks your fasting blood sugar level. Have the screening done: Once every three years after age 53 if you are at a normal weight and have a low risk for diabetes. More often and at a younger age if you are overweight or have a high risk for diabetes. What should I know about preventing infection? Hepatitis B If you have a higher risk for hepatitis B, you should be screened for this virus. Talk with your health care provider to find out if you are at risk for hepatitis B infection. Hepatitis C Testing is recommended for: Everyone born from [birth year] through 1966. Anyone with known risk factors for hepatitis C. Sexually transmitted infections (STIs) Get screened for STIs, including gonorrhea and chlamydia, if: You are sexually active and are younger than 63 years of age. You are older than 63 years of age and your health care provider tells you that you are at risk for this type of infection. Your sexual activity has changed  since you were last screened, and you are at increased risk for chlamydia or gonorrhea. Ask your health care provider if you are at risk. Ask your health care provider about whether you are at high risk for HIV. Your health care provider may recommend a prescription medicine to help prevent HIV infection. If you choose to take medicine to prevent HIV, you should first get tested for HIV. You should then  be tested every 3 months for as long as you are taking the medicine. Pregnancy If you are about to stop having your period (premenopausal) and you may become pregnant, seek counseling before you get pregnant. Take 400 to 800 micrograms (mcg) of folic acid every day if you become pregnant. Ask for birth control (contraception) if you want to prevent pregnancy. Osteoporosis and menopause Osteoporosis is a disease in which the bones lose minerals and strength with aging. This can result in bone fractures. If you are 59 years old or older, or if you are at risk for osteoporosis and fractures, ask your health care provider if you should: Be screened for bone loss. Take a calcium or vitamin D supplement to lower your risk of fractures. Be given hormone replacement therapy (HRT) to treat symptoms of menopause. Follow these instructions at home: Alcohol use Do not drink alcohol if: Your health care provider tells you not to drink. You are pregnant, may be pregnant, or are planning to become pregnant. If you drink alcohol: Limit how much you have to: 0-1 drink a day. Know how much alcohol is in your drink. In the [place] one drink equals one 12 oz bottle of beer (355 mL), one 5 oz glass of wine (148 mL), or one 1 oz glass of hard liquor (44 mL). Lifestyle Do not use any products that contain nicotine or tobacco. These products include cigarettes, chewing tobacco, and vaping devices, such as e-cigarettes. If you need help quitting, ask your health care provider. Do not use street drugs. Do not share needles. Ask your health care provider for help if you need support or information about quitting drugs. General instructions Schedule regular health, dental, and eye exams. Stay current with your vaccines. Tell your health care provider if: You often feel depressed. You have ever been abused or do not feel safe at home. Summary Adopting a healthy lifestyle and getting preventive care are important in  promoting health and wellness. Follow your health care provider's instructions about healthy diet, exercising, and getting tested or screened for diseases. Follow your health care provider's instructions on monitoring your cholesterol and blood pressure. This information is not intended to replace advice given to you by your health care provider. Make sure you discuss any questions you have with your health care provider. Document Revised: 03/21/2021 Document Reviewed: 03/21/2021 Elsevier Patient Education  2024 [employer].

## 2023-05-07 ENCOUNTER — Ambulatory Visit (INDEPENDENT_AMBULATORY_CARE_PROVIDER_SITE_OTHER): Payer: Commercial Managed Care - HMO | Admitting: Internal Medicine

## 2023-05-07 VITALS — BP 104/70 | HR 68 | Temp 98.6°F | Ht 68.5 in | Wt 167.0 lb

## 2023-05-07 DIAGNOSIS — Z136 Encounter for screening for cardiovascular disorders: Secondary | ICD-10-CM | POA: Insufficient documentation

## 2023-05-07 DIAGNOSIS — E7800 Pure hypercholesterolemia, unspecified: Secondary | ICD-10-CM

## 2023-05-07 DIAGNOSIS — Z13220 Encounter for screening for lipoid disorders: Secondary | ICD-10-CM

## 2023-05-07 DIAGNOSIS — Z1211 Encounter for screening for malignant neoplasm of colon: Secondary | ICD-10-CM

## 2023-05-07 DIAGNOSIS — Z0000 Encounter for general adult medical examination without abnormal findings: Secondary | ICD-10-CM

## 2023-05-07 LAB — CBC WITH DIFFERENTIAL/PLATELET
Basophils Absolute: 0 10*3/uL (ref 0.0–0.1)
Basophils Relative: 0.8 % (ref 0.0–3.0)
Eosinophils Absolute: 0.2 10*3/uL (ref 0.0–0.7)
Eosinophils Relative: 4.1 % (ref 0.0–5.0)
HCT: 45 % (ref 36.0–46.0)
Hemoglobin: 15 g/dL (ref 12.0–15.0)
Lymphocytes Relative: 42 % (ref 12.0–46.0)
Lymphs Abs: 2.3 10*3/uL (ref 0.7–4.0)
MCHC: 33.4 g/dL (ref 30.0–36.0)
MCV: 91 fl (ref 78.0–100.0)
Monocytes Absolute: 0.6 10*3/uL (ref 0.1–1.0)
Monocytes Relative: 10.4 % (ref 3.0–12.0)
Neutro Abs: 2.3 10*3/uL (ref 1.4–7.7)
Neutrophils Relative %: 42.7 % — ABNORMAL LOW (ref 43.0–77.0)
Platelets: 253 10*3/uL (ref 150.0–400.0)
RBC: 4.94 Mil/uL (ref 3.87–5.11)
RDW: 12.9 % (ref 11.5–15.5)
WBC: 5.5 10*3/uL (ref 4.0–10.5)

## 2023-05-07 LAB — LIPID PANEL
Cholesterol: 234 mg/dL — ABNORMAL HIGH (ref 0–200)
HDL: 53.7 mg/dL (ref >39.00)
LDL Cholesterol: 166 mg/dL — ABNORMAL HIGH (ref 0–99)
NonHDL: 180.24
Total CHOL/HDL Ratio: 4
Triglycerides: 73 mg/dL (ref 0.0–149.0)
VLDL: 14.6 mg/dL (ref 0.0–40.0)

## 2023-05-07 LAB — COMPREHENSIVE METABOLIC PANEL
ALT: 15 U/L (ref 0–35)
AST: 23 U/L (ref 0–37)
Albumin: 4.6 g/dL (ref 3.5–5.2)
Alkaline Phosphatase: 66 U/L (ref 39–117)
BUN: 15 mg/dL (ref 6–23)
CO2: 27 mEq/L (ref 19–32)
Calcium: 10.2 mg/dL (ref 8.4–10.5)
Chloride: 105 mEq/L (ref 96–112)
Creatinine, Ser: 0.86 mg/dL (ref 0.40–1.20)
GFR: 71.88 mL/min (ref >60.00)
Glucose, Bld: 81 mg/dL (ref 70–99)
Potassium: 4.4 mEq/L (ref 3.5–5.1)
Sodium: 138 mEq/L (ref 135–145)
Total Bilirubin: 1.1 mg/dL (ref 0.2–1.2)
Total Protein: 7.7 g/dL (ref 6.0–8.3)

## 2023-05-07 LAB — TSH: TSH: 1.44 u[IU]/mL (ref 0.35–5.50)

## 2023-05-07 NOTE — Assessment & Plan Note (Signed)
Elevated LDL, low ascvd risk Will get Ct CAC score

## 2023-05-07 NOTE — Assessment & Plan Note (Signed)
Chronic Regular exercise and healthy diet encouraged Check lipid panel, CMP, CBC, tsh Currently controlled with lifestyle  Low ASCVD risk

## 2023-05-11 ENCOUNTER — Encounter (HOSPITAL_COMMUNITY): Payer: Self-pay

## 2023-06-12 ENCOUNTER — Ambulatory Visit (HOSPITAL_BASED_OUTPATIENT_CLINIC_OR_DEPARTMENT_OTHER)
Admission: RE | Admit: 2023-06-12 | Discharge: 2023-06-12 | Disposition: A | Discharge status: Home / Self Care | Payer: Commercial Managed Care - HMO | Source: Ambulatory Visit | Attending: Internal Medicine | Admitting: Internal Medicine

## 2023-06-12 DIAGNOSIS — Z136 Encounter for screening for cardiovascular disorders: Secondary | ICD-10-CM | POA: Insufficient documentation

## 2023-07-06 IMAGING — MG MM DIGITAL SCREENING BILAT W/ TOMO AND CAD
6 of 10 series · 6 of 30 positions shown · non-contrast
Comparison: Previous exam(s).

CLINICAL DATA: Screening.

EXAM:
DIGITAL SCREENING BILATERAL MAMMOGRAM WITH TOMOSYNTHESIS AND CAD
TECHNIQUE: Bilateral screening digital craniocaudal and mediolateral oblique
mammograms were obtained. Bilateral screening digital breast
tomosynthesis was performed. The images were evaluated with
computer-aided detection.

[R XCCM synth-2D]
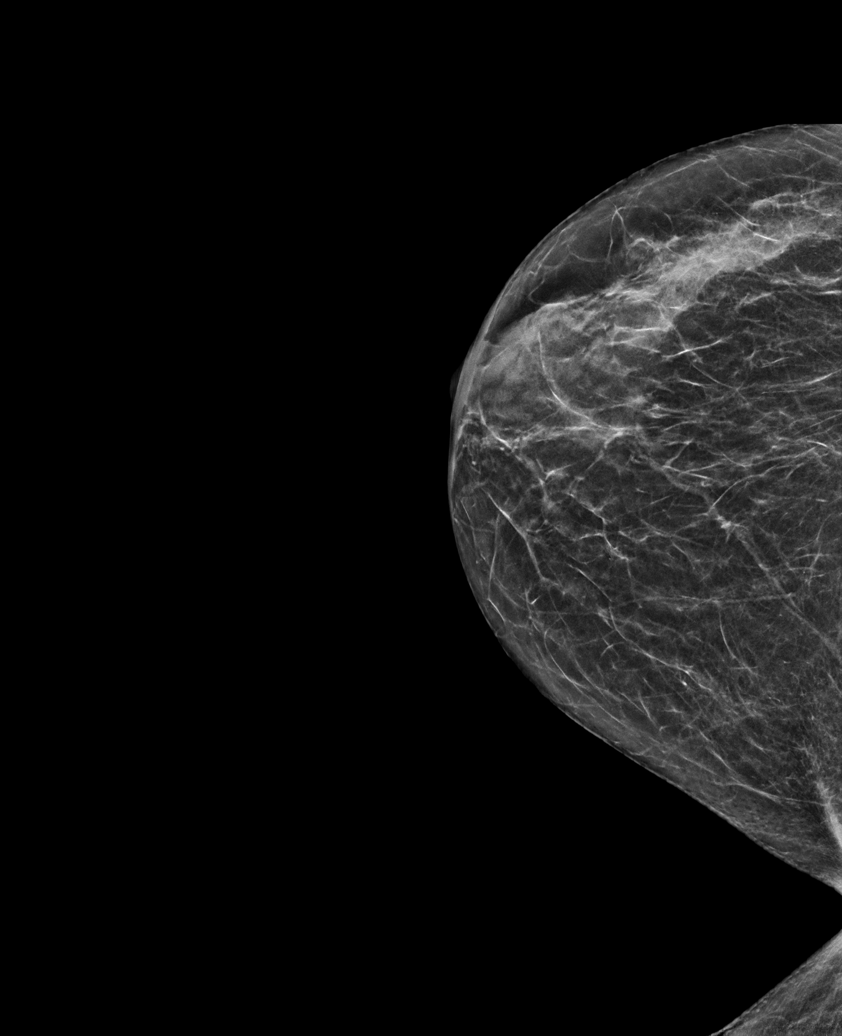

[L CC synth-2D]
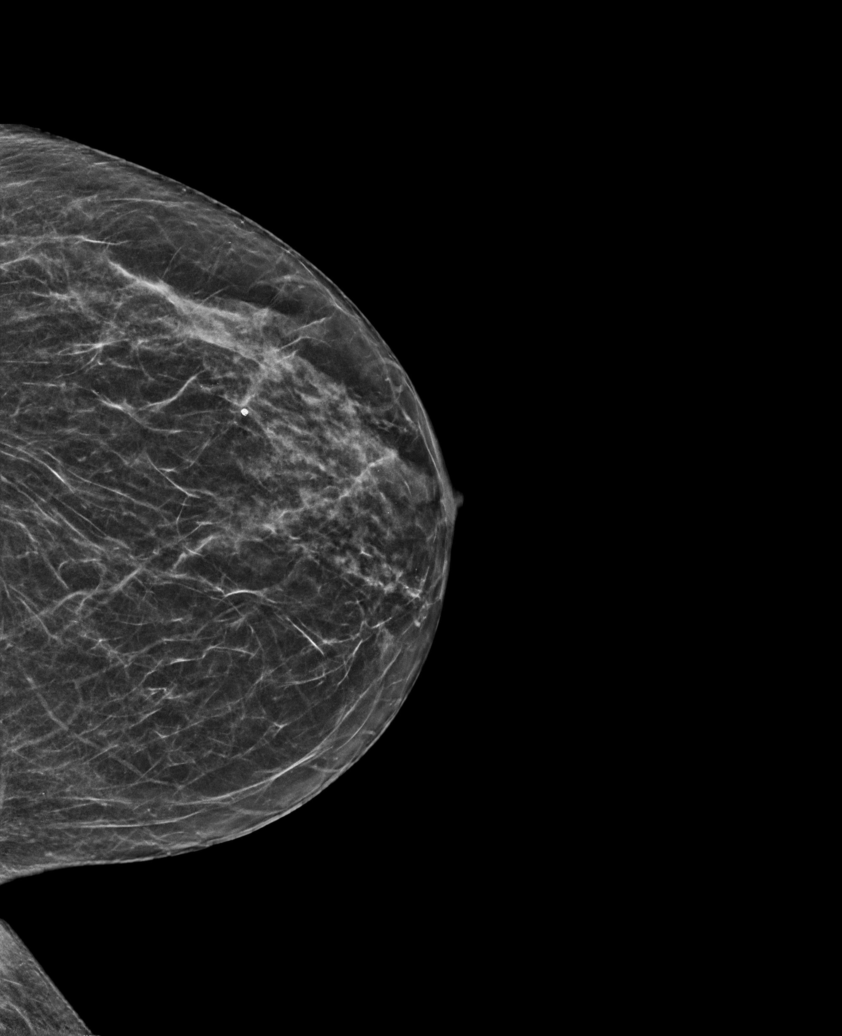

[L MLO synth-2D]
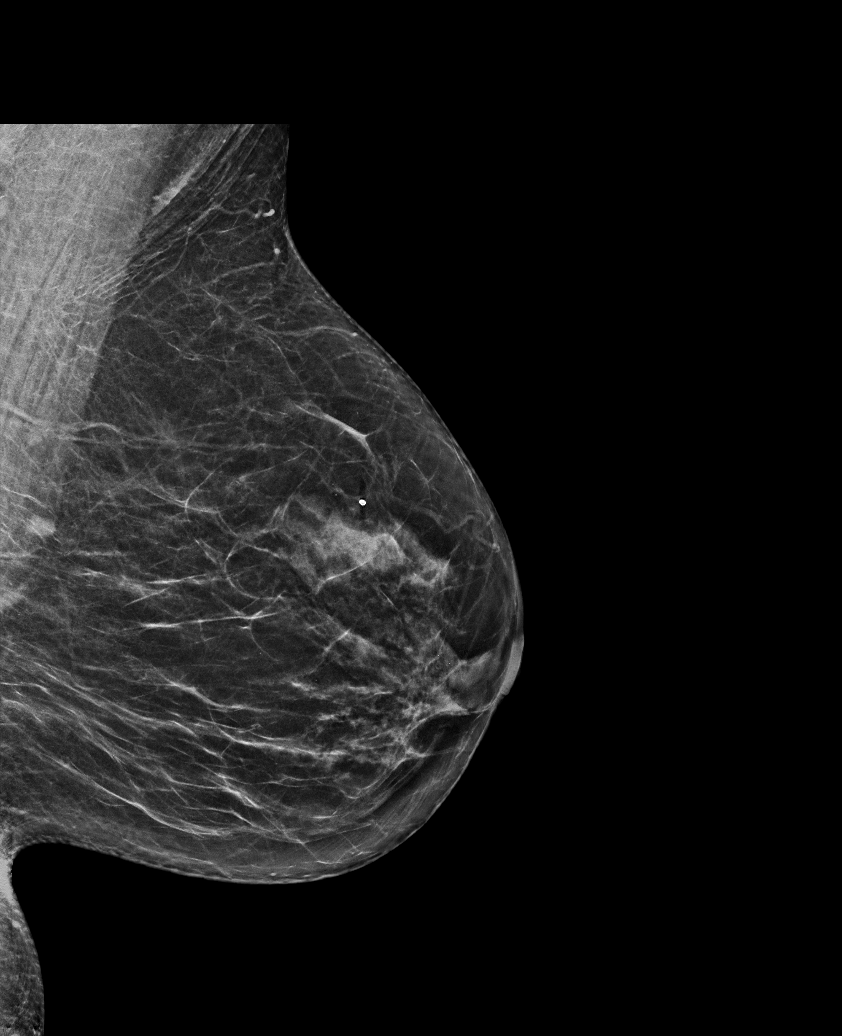

[R MLO synth-2D]
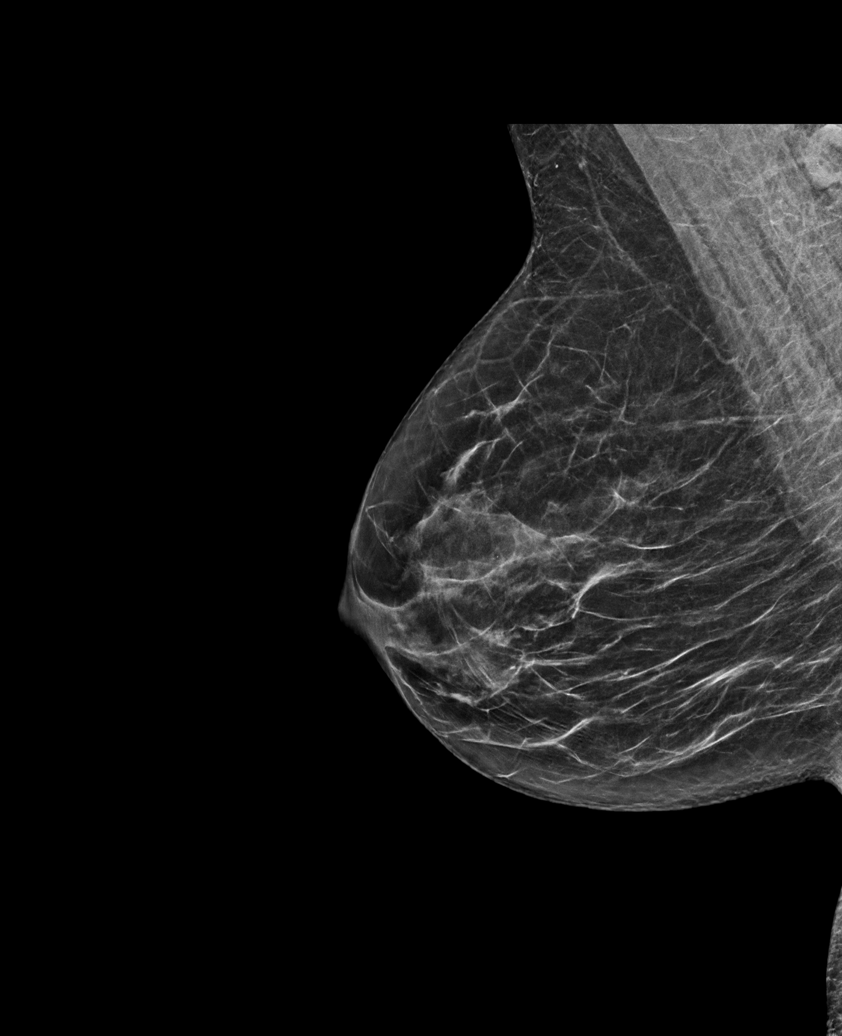

[R CC synth-2D]
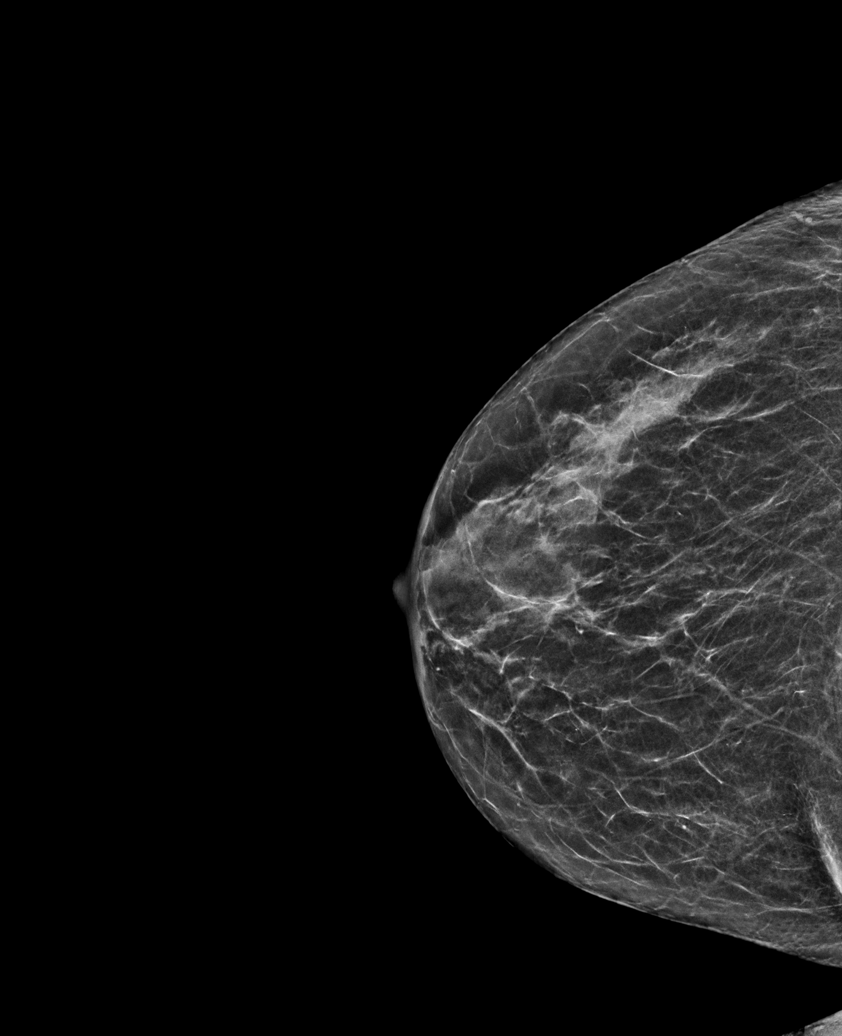

[R CC tomo · tomo slice 27/52.0]
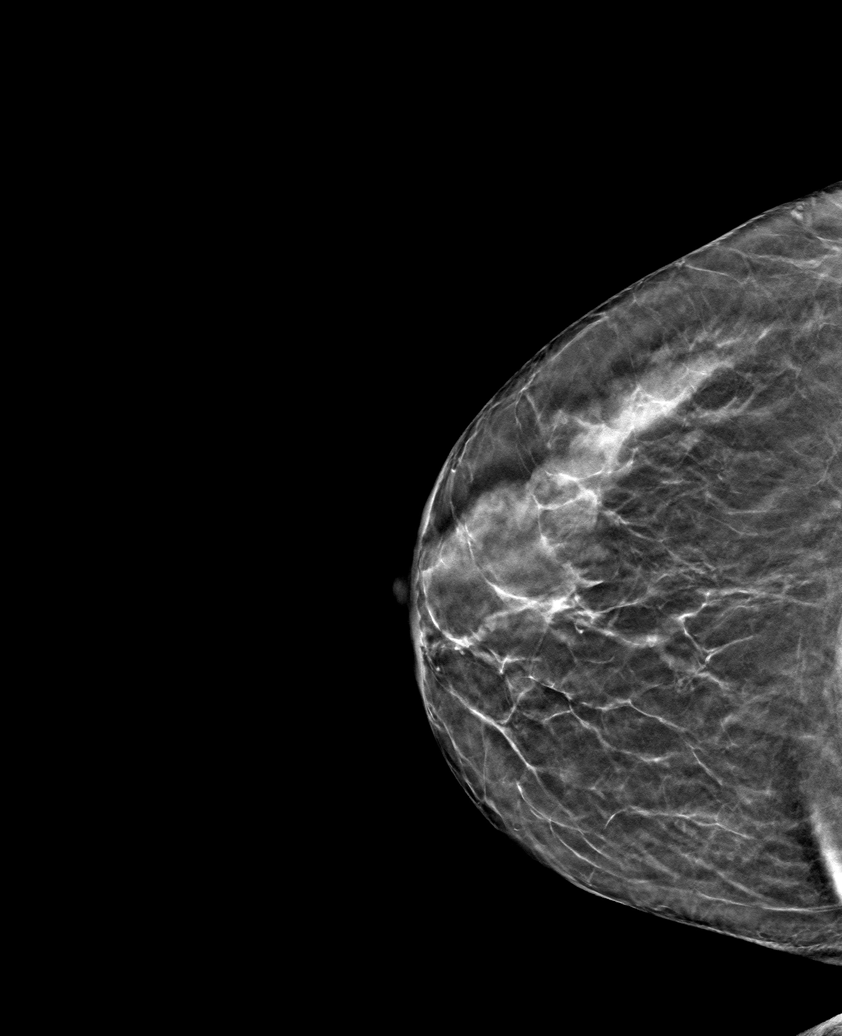

[6 of 30 positions shown; findings below may reference images not displayed]

ACR Breast Density Category b: There are scattered areas of
fibroglandular density.
FINDINGS: There are no findings suspicious for malignancy.
IMPRESSION: No mammographic evidence of malignancy. A result letter of this
screening mammogram will be mailed directly to the patient.

RECOMMENDATION:
Screening mammogram in one year. (Code:51-O-LD2)

BI-RADS CATEGORY  1: Negative.

## 2024-05-07 ENCOUNTER — Encounter: Payer: Self-pay | Admitting: Internal Medicine

## 2024-05-07 NOTE — Patient Instructions (Addendum)

## 2024-05-07 NOTE — Progress Notes (Unsigned)
 Subjective:    Patient ID: [name], female    DOB: March 02, 1961, 63 y.o.   MRN: [id number]      HPI [name] is here for a Physical exam and her chronic medical problems.    Doing well overall - having URI symptoms  - this is week 3.    Medications and allergies reviewed with patient and updated if appropriate.  Current Outpatient Medications on File Prior to Visit  Medication Sig Dispense Refill   estradiol  (ESTRACE  VAGINAL) 0.1 MG/GM vaginal cream Place 1 g vaginally 2 (two) times a week. 42.5 g 2   No current facility-administered medications on file prior to visit.    Review of Systems  Constitutional:  Positive for fatigue and fever (with uri).  HENT:  Positive for congestion (discolored mucus), ear pain, postnasal drip and sinus pressure. Negative for sore throat.   Eyes:  Negative for visual disturbance.  Respiratory:  Positive for cough (productive) and shortness of breath (a little). Negative for wheezing.   Cardiovascular:  Negative for chest pain, palpitations and leg swelling.  Gastrointestinal:  Negative for abdominal pain, blood in stool, constipation and diarrhea.       No gerd  Genitourinary:  Negative for dysuria.  Musculoskeletal:  Negative for arthralgias and back pain.  Skin:  Negative for rash.  Neurological:  Positive for headaches (with URI). Negative for dizziness and light-headedness.  Psychiatric/Behavioral:  Negative for dysphoric mood. The patient is not nervous/anxious.        Objective:   Vitals:   05/08/24 0751  BP: 104/72  Pulse: 79  Temp: 98.3 F (36.8 C)  SpO2: 98%   Filed Weights   05/08/24 0751  Weight: 168 lb (76.2 kg)   Body mass index is 25.17 kg/m.  BP Readings from Last 3 Encounters:  05/08/24 104/72  05/07/23 104/70  01/10/23 100/78    Wt Readings from Last 3 Encounters:  05/08/24 168 lb (76.2 kg)  05/07/23 167 lb (75.8 kg)  01/10/23 169 lb (76.7 kg)       Physical Exam Constitutional: She  appears well-developed and well-nourished. No distress.  HENT:  Head: Normocephalic and atraumatic.  Right Ear: External ear normal. Normal ear canal and TM Left Ear: External ear normal.  Normal ear canal and TM Mouth/Throat: Oropharynx is clear and moist.  Eyes: Conjunctivae normal.  Neck: Neck supple. No tracheal deviation present. No thyromegaly present.  No carotid bruit  Cardiovascular: Normal rate, regular rhythm and normal heart sounds.   No murmur heard.  No edema. Pulmonary/Chest: Wet cough. Effort normal and breath sounds normal. No respiratory distress. She has no wheezes. She has no rales.  Breast: deferred   Abdominal: Soft. She exhibits no distension. There is no tenderness.  Lymphadenopathy: She has no cervical adenopathy.  Skin: Skin is warm and dry. She is not diaphoretic.  Psychiatric: She has a normal mood and affect. Her behavior is normal.     Lab Results  Component Value Date   WBC 5.5 05/07/2023   HGB 15.0 05/07/2023   HCT 45.0 05/07/2023   PLT 253.0 05/07/2023   GLUCOSE 81 05/07/2023   CHOL 234 (H) 05/07/2023   TRIG 73.0 05/07/2023   HDL 53.70 05/07/2023   LDLCALC 166 (H) 05/07/2023   ALT 15 05/07/2023   AST 23 05/07/2023   NA 138 05/07/2023   K 4.4 05/07/2023   CL 105 05/07/2023   CREATININE 0.86 05/07/2023   BUN 15 05/07/2023   CO2 27 05/07/2023  TSH 1.44 05/07/2023   INR 0.9 01/20/2008   HGBA1C 5.1 07/25/2017    The 10-year ASCVD risk score (Arnett DK, et al., 2019) is: 3.7%   Values used to calculate the score:     Age: 48 years     Clincally relevant sex: Female     Is Non-Hispanic African American: No     Diabetic: No     Tobacco smoker: No     Systolic Blood Pressure: 104 mmHg     Is BP treated: No     HDL Cholesterol: 53.7 mg/dL     Total Cholesterol: 234 mg/dL      Assessment & Plan:   Physical exam: Screening blood work  ordered Exercise   normal - gets 6-10 miles a day with exercise and work Weight  is good Substance  abuse  none   Reviewed recommended immunizations.   Health Maintenance  Topic Date Due   COVID-19 Vaccine (4 - 2024-25 season) 05/23/2024 (Originally 07/15/2023)   INFLUENZA VACCINE  06/13/2024   MAMMOGRAM  04/11/2025   Cervical Cancer Screening (HPV/Pap Cotest)  12/17/2025   DTaP/Tdap/Td (2 - Td or Tdap) 03/14/2026   Fecal DNA (Cologuard)  06/04/2026   Hepatitis C Screening  Completed   HIV Screening  Completed   Hepatitis B Vaccines  Aged Out   HPV VACCINES  Aged Out   Meningococcal B Vaccine  Aged Out   Zoster Vaccines- Shingrix  Discontinued          See Problem List for Assessment and Plan of chronic medical problems.

## 2024-05-08 ENCOUNTER — Ambulatory Visit (INDEPENDENT_AMBULATORY_CARE_PROVIDER_SITE_OTHER): Payer: Commercial Managed Care - HMO | Admitting: Internal Medicine

## 2024-05-08 VITALS — BP 104/72 | HR 79 | Temp 98.3°F | Ht 68.5 in | Wt 168.0 lb

## 2024-05-08 DIAGNOSIS — E7800 Pure hypercholesterolemia, unspecified: Secondary | ICD-10-CM

## 2024-05-08 DIAGNOSIS — J019 Acute sinusitis, unspecified: Secondary | ICD-10-CM | POA: Diagnosis not present

## 2024-05-08 DIAGNOSIS — Z0000 Encounter for general adult medical examination without abnormal findings: Secondary | ICD-10-CM

## 2024-05-08 LAB — COMPREHENSIVE METABOLIC PANEL WITH GFR
ALT: 16 U/L (ref 0–35)
AST: 21 U/L (ref 0–37)
Albumin: 4.5 g/dL (ref 3.5–5.2)
Alkaline Phosphatase: 83 U/L (ref 39–117)
BUN: 17 mg/dL (ref 6–23)
CO2: 27 meq/L (ref 19–32)
Calcium: 10.1 mg/dL (ref 8.4–10.5)
Chloride: 104 meq/L (ref 96–112)
Creatinine, Ser: 0.81 mg/dL (ref 0.40–1.20)
GFR: 76.7 mL/min (ref >60.00)
Glucose, Bld: 84 mg/dL (ref 70–99)
Potassium: 4.6 meq/L (ref 3.5–5.1)
Sodium: 139 meq/L (ref 135–145)
Total Bilirubin: 0.4 mg/dL (ref 0.2–1.2)
Total Protein: 7.8 g/dL (ref 6.0–8.3)

## 2024-05-08 LAB — CBC WITH DIFFERENTIAL/PLATELET
Basophils Absolute: 0 10*3/uL (ref 0.0–0.1)
Basophils Relative: 0.6 % (ref 0.0–3.0)
Eosinophils Absolute: 0.3 10*3/uL (ref 0.0–0.7)
Eosinophils Relative: 4.5 % (ref 0.0–5.0)
HCT: 42.9 % (ref 36.0–46.0)
Hemoglobin: 14.6 g/dL (ref 12.0–15.0)
Lymphocytes Relative: 34.4 % (ref 12.0–46.0)
Lymphs Abs: 2.2 10*3/uL (ref 0.7–4.0)
MCHC: 34.1 g/dL (ref 30.0–36.0)
MCV: 88.8 fl (ref 78.0–100.0)
Monocytes Absolute: 0.5 10*3/uL (ref 0.1–1.0)
Monocytes Relative: 8.6 % (ref 3.0–12.0)
Neutro Abs: 3.3 10*3/uL (ref 1.4–7.7)
Neutrophils Relative %: 51.9 % (ref 43.0–77.0)
Platelets: 378 10*3/uL (ref 150.0–400.0)
RBC: 4.83 Mil/uL (ref 3.87–5.11)
RDW: 13.3 % (ref 11.5–15.5)
WBC: 6.3 10*3/uL (ref 4.0–10.5)

## 2024-05-08 LAB — LIPID PANEL
Cholesterol: 245 mg/dL — ABNORMAL HIGH (ref 0–200)
HDL: 53.4 mg/dL (ref >39.00)
LDL Cholesterol: 169 mg/dL — ABNORMAL HIGH (ref 0–99)
NonHDL: 191.49
Total CHOL/HDL Ratio: 5
Triglycerides: 111 mg/dL (ref 0.0–149.0)
VLDL: 22.2 mg/dL (ref 0.0–40.0)

## 2024-05-08 LAB — TSH: TSH: 1.66 u[IU]/mL (ref 0.35–5.50)

## 2024-05-08 MED ORDER — AMOXICILLIN-POT CLAVULANATE 875-125 MG PO TABS: 1.0000 | ORAL_TABLET | Freq: Two times a day (BID) | ORAL | 0 refills | Status: AC

## 2024-05-08 NOTE — Assessment & Plan Note (Signed)
 Acute Likely bacterial  Start Augmentin 875-125 mg BID x 10 day otc cold medications Rest, fluid Call if no improvement

## 2024-05-08 NOTE — Assessment & Plan Note (Signed)
Chronic Regular exercise and healthy diet encouraged Check lipid panel, CMP, CBC, tsh Currently controlled with lifestyle  Low ASCVD risk

## 2024-05-10 ENCOUNTER — Ambulatory Visit: Payer: Self-pay | Admitting: Internal Medicine

## 2024-06-18 ENCOUNTER — Encounter: Payer: Self-pay | Admitting: Nurse Practitioner

## 2024-06-18 ENCOUNTER — Other Ambulatory Visit: Payer: Self-pay | Admitting: Nurse Practitioner

## 2024-06-18 DIAGNOSIS — Z1231 Encounter for screening mammogram for malignant neoplasm of breast: Secondary | ICD-10-CM

## 2024-07-16 ENCOUNTER — Ambulatory Visit
Admission: RE | Admit: 2024-07-16 | Discharge: 2024-07-16 | Disposition: A | Discharge status: Home / Self Care | Source: Ambulatory Visit | Attending: Nurse Practitioner

## 2024-07-16 DIAGNOSIS — Z1231 Encounter for screening mammogram for malignant neoplasm of breast: Secondary | ICD-10-CM | POA: Diagnosis not present

## 2024-08-25 ENCOUNTER — Encounter: Payer: Self-pay | Admitting: Nurse Practitioner

## 2024-08-25 ENCOUNTER — Ambulatory Visit (INDEPENDENT_AMBULATORY_CARE_PROVIDER_SITE_OTHER): Admitting: Nurse Practitioner

## 2024-08-25 VITALS — BP 112/70 | HR 86 | Ht 68.25 in | Wt 170.0 lb

## 2024-08-25 DIAGNOSIS — N904 Leukoplakia of vulva: Secondary | ICD-10-CM | POA: Diagnosis not present

## 2024-08-25 DIAGNOSIS — N952 Postmenopausal atrophic vaginitis: Secondary | ICD-10-CM

## 2024-08-25 DIAGNOSIS — Z01419 Encounter for gynecological examination (general) (routine) without abnormal findings: Secondary | ICD-10-CM | POA: Diagnosis not present

## 2024-08-25 DIAGNOSIS — Z1331 Encounter for screening for depression: Secondary | ICD-10-CM

## 2024-08-25 MED ORDER — CLOBETASOL PROPIONATE 0.05 % EX OINT
1.0000 | TOPICAL_OINTMENT | Freq: Every day | CUTANEOUS | 1 refills | Status: AC
Start: 2024-08-25 — End: ?

## 2024-08-25 NOTE — Progress Notes (Signed)
 [name] September 12, 1961 [id number]   History:  63 y.o. [id number] presents for annual exam. Postmenopausal - no HRT, no bleeding. Complains of dryness and painful intercourse. Tried vaginal estrogen and did not feel well with it. Lichen sclerosus confirmed by biopsy 12/2020, using Aquafor with good management. 2004 LEEP, paps normal since.  Mother and sister with history of breast, uterine, and ovarian cancer. Declines genetic testing. H/O RSO for dermoid cyst.   Gynecologic History Patient's last menstrual period was 08/13/2012.   Contraception/Family planning: post menopausal status Sexually active: Yes  Health Maintenance Last Pap: 12/17/2020. Results were: Normal neg HPV Last mammogram: 07/16/2024. Results were: Normal Last colonoscopy: Never. Negative Cologuard 05/2023 Last Dexa: Never     08/25/2024    7:32 AM  Depression screen PHQ 2/9  Decreased Interest 0  Down, Depressed, Hopeless 0  PHQ - 2 Score 0     Past medical history, past surgical history, family history and social history were all reviewed and documented in the EPIC chart. Married. 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 Daughter lives local, has identical twin boys age 77, 27 yo son. Son lives in [place], almost 1 yo daughter. Mother diagnosed with breast cancer in her 63s, sister in her 2s. Both also had ovarian and uterine cancer. Mother deceased and no relationship with sister, unsure if genetic testing ever done.   ROS:  A ROS was performed and pertinent positives and negatives are included.  Exam:  Vitals:   08/25/24 0730  BP: 112/70  Pulse: 86  SpO2: 99%  Weight: 170 lb (77.1 kg)  Height: 5' 8.25 (1.734 m)     Body mass index is 25.66 kg/m.  General appearance:  Normal Thyroid :  Symmetrical, normal in size, without palpable masses or nodularity. Respiratory  Auscultation:  Clear without wheezing or rhonchi Cardiovascular  Auscultation:  Regular rate, without rubs, murmurs or  gallops  Edema/varicosities:  Not grossly evident Abdominal  Soft,nontender, without masses, guarding or rebound.  Liver/spleen:  No organomegaly noted  Hernia:  None appreciated  Skin  Inspection:  Grossly normal Breasts: Examined lying and sitting.   Right: Without masses, retractions, nipple discharge or axillary adenopathy.   Left: Without masses, retractions, nipple discharge or axillary adenopathy. Genitourinary   Inguinal/mons:  Normal without inguinal adenopathy  External genitalia:  Normal appearing vulva with no masses, tenderness, or lesions. Very mild hypopigmentation in folds of labia minora and majora c/w LS  BUS/Urethra/Skene's glands:  Normal  Vagina:  Normal appearing with normal color and discharge, no lesions. Atrophic changes  Cervix:  Normal appearing without discharge or lesions  Uterus:  Normal in size, shape and contour.  Midline and mobile, nontender  Adnexa/parametria:     Rt: Normal in size, without masses or tenderness.   Lt: Normal in size, without masses or tenderness.  Anus and perineum: Normal  Digital rectal exam: Deferred  [name], CMA present as chaperone.   Assessment/Plan:  63 y.o. [id number] for annual exam.   Well female exam with routine gynecological exam - Education provided on SBEs, importance of preventative screenings, current guidelines, high calcium diet, regular exercise, and multivitamin daily. Labs with PCP.   Postmenopausal - no HRT, no bleeding.   Lichen sclerosus of female genitalia - Plan: clobetasol ointment (TEMOVATE) 0.05 % twice weekly. Increase to daily for a few days as needed. Confirmed by biopsy 2022. Using Aquafor occasionally.   Vaginal atrophy - Complains of dryness and painful intercourse. Tried vaginal estrogen and did not feel  well with it. Recommend Vit E or hyaluronic acid vaginal suppositories twice weekly. UberLube samples provided. Oil or silicone lubricant with intercourse.   Screening for cervical cancer  - 2004 LEEP, paps normal since. Will repeat at 5-year interval per guidelines.  Screening for breast cancer - Normal mammogram history. Continue annual screenings. Normal breast exam today.  Screening for colon cancer - Negative Cologuard in 2024.   Screening for osteoporosis - Average risk. Will plan for DXA at age 63. She is very active with exercise. Recommend taking daily Vitamin D  supplement and consuming high calcium diet.    Return in about 1 year (around 08/25/2025) for Annual.     Carly DELENA Shutter DNP, 8:05 AM 08/25/2024

## 2025-05-11 ENCOUNTER — Encounter: Admitting: Internal Medicine
